# Patient Record
Sex: Female | Born: 1965 | Race: White | Hispanic: No | Marital: Single | Smoking: Current every day smoker
Health system: Southern US, Community
[De-identification: ages and names within clinical notes are randomized; demographics above are authoritative.]

## PROBLEM LIST (undated history)

## (undated) DIAGNOSIS — E119 Type 2 diabetes mellitus without complications: Secondary | ICD-10-CM

## (undated) DIAGNOSIS — K219 Gastro-esophageal reflux disease without esophagitis: Secondary | ICD-10-CM

## (undated) DIAGNOSIS — E079 Disorder of thyroid, unspecified: Secondary | ICD-10-CM

## (undated) DIAGNOSIS — E249 Cushing's syndrome, unspecified: Secondary | ICD-10-CM

## (undated) DIAGNOSIS — J449 Chronic obstructive pulmonary disease, unspecified: Secondary | ICD-10-CM

## (undated) DIAGNOSIS — I10 Essential (primary) hypertension: Secondary | ICD-10-CM

## (undated) HISTORY — PX: TUBAL LIGATION: SHX77

## (undated) HISTORY — PX: BLADDER SURGERY: SHX569

## (undated) HISTORY — PX: PARTIAL HYSTERECTOMY: SHX80

## (undated) HISTORY — PX: DILATION AND CURETTAGE OF UTERUS: SHX78

---

## 1999-09-26 ENCOUNTER — Emergency Department (HOSPITAL_COMMUNITY): Admission: EM | Admit: 1999-09-26 | Discharge: 1999-09-26 | Payer: Self-pay | Admitting: Emergency Medicine

## 1999-10-13 ENCOUNTER — Emergency Department (HOSPITAL_COMMUNITY): Admission: EM | Admit: 1999-10-13 | Discharge: 1999-10-13 | Payer: Self-pay | Admitting: Emergency Medicine

## 2002-06-21 ENCOUNTER — Emergency Department (HOSPITAL_COMMUNITY): Admission: EM | Admit: 2002-06-21 | Discharge: 2002-06-21 | Payer: Self-pay | Admitting: Emergency Medicine

## 2003-02-18 ENCOUNTER — Emergency Department (HOSPITAL_COMMUNITY): Admission: EM | Admit: 2003-02-18 | Discharge: 2003-02-18 | Payer: Self-pay

## 2003-08-18 ENCOUNTER — Other Ambulatory Visit: Admission: RE | Admit: 2003-08-18 | Discharge: 2003-08-18 | Payer: Self-pay | Admitting: Obstetrics and Gynecology

## 2004-07-24 ENCOUNTER — Emergency Department (HOSPITAL_COMMUNITY): Admission: EM | Admit: 2004-07-24 | Discharge: 2004-07-25 | Payer: Self-pay | Admitting: Emergency Medicine

## 2004-09-18 ENCOUNTER — Emergency Department (HOSPITAL_COMMUNITY): Admission: EM | Admit: 2004-09-18 | Discharge: 2004-09-18 | Payer: Self-pay | Admitting: Emergency Medicine

## 2005-04-02 ENCOUNTER — Other Ambulatory Visit: Admission: RE | Admit: 2005-04-02 | Discharge: 2005-04-02 | Payer: Self-pay | Admitting: Obstetrics and Gynecology

## 2006-01-14 ENCOUNTER — Emergency Department (HOSPITAL_COMMUNITY): Admission: EM | Admit: 2006-01-14 | Discharge: 2006-01-15 | Payer: Self-pay | Admitting: Emergency Medicine

## 2006-07-03 ENCOUNTER — Emergency Department (HOSPITAL_COMMUNITY): Admission: EM | Admit: 2006-07-03 | Discharge: 2006-07-03 | Payer: Self-pay | Admitting: Emergency Medicine

## 2006-07-05 ENCOUNTER — Ambulatory Visit: Payer: Self-pay | Admitting: Vascular Surgery

## 2006-07-05 ENCOUNTER — Emergency Department (HOSPITAL_COMMUNITY): Admission: EM | Admit: 2006-07-05 | Discharge: 2006-07-05 | Payer: Self-pay | Admitting: Emergency Medicine

## 2007-03-03 ENCOUNTER — Emergency Department (HOSPITAL_COMMUNITY): Admission: EM | Admit: 2007-03-03 | Discharge: 2007-03-03 | Payer: Self-pay | Admitting: Emergency Medicine

## 2008-06-01 ENCOUNTER — Ambulatory Visit (HOSPITAL_COMMUNITY): Admission: RE | Admit: 2008-06-01 | Discharge: 2008-06-01 | Payer: Self-pay | Admitting: Family Medicine

## 2008-06-04 ENCOUNTER — Encounter: Admission: RE | Admit: 2008-06-04 | Discharge: 2008-06-04 | Payer: Self-pay | Admitting: Family Medicine

## 2009-06-07 ENCOUNTER — Encounter: Admission: RE | Admit: 2009-06-07 | Discharge: 2009-06-07 | Payer: Self-pay | Admitting: Family Medicine

## 2009-08-03 ENCOUNTER — Encounter: Payer: Self-pay | Admitting: Family Medicine

## 2009-08-03 ENCOUNTER — Ambulatory Visit (HOSPITAL_COMMUNITY): Admission: RE | Admit: 2009-08-03 | Discharge: 2009-08-03 | Payer: Self-pay | Admitting: Family Medicine

## 2009-08-03 ENCOUNTER — Ambulatory Visit: Payer: Self-pay | Admitting: Vascular Surgery

## 2009-12-17 ENCOUNTER — Emergency Department (HOSPITAL_COMMUNITY): Admission: EM | Admit: 2009-12-17 | Discharge: 2009-12-17 | Payer: Self-pay | Admitting: Emergency Medicine

## 2010-05-25 ENCOUNTER — Emergency Department (HOSPITAL_COMMUNITY)
Admission: EM | Admit: 2010-05-25 | Discharge: 2010-05-25 | Payer: Self-pay | Source: Home / Self Care | Admitting: Emergency Medicine

## 2010-05-25 LAB — POCT I-STAT, CHEM 8
BUN: 11 mg/dL (ref 6–23)
Calcium, Ion: 1.12 mmol/L (ref 1.12–1.32)
Chloride: 105 mEq/L (ref 96–112)
Creatinine, Ser: 0.8 mg/dL (ref 0.4–1.2)
Glucose, Bld: 95 mg/dL (ref 70–99)
TCO2: 26 mmol/L (ref 0–100)

## 2010-05-25 LAB — LIPASE, BLOOD: Lipase: 27 U/L (ref 11–59)

## 2010-07-13 LAB — URINALYSIS, ROUTINE W REFLEX MICROSCOPIC
Glucose, UA: NEGATIVE mg/dL
Hgb urine dipstick: NEGATIVE
Protein, ur: NEGATIVE mg/dL
Specific Gravity, Urine: 1.028 (ref 1.005–1.030)
pH: 5.5 (ref 5.0–8.0)

## 2010-07-13 LAB — POCT I-STAT, CHEM 8
BUN: 21 mg/dL (ref 6–23)
Calcium, Ion: 0.94 mmol/L — ABNORMAL LOW (ref 1.12–1.32)
Chloride: 109 mEq/L (ref 96–112)
Creatinine, Ser: 0.4 mg/dL (ref 0.4–1.2)

## 2010-07-13 LAB — POCT PREGNANCY, URINE

## 2010-07-13 LAB — WET PREP, GENITAL
Trich, Wet Prep: NONE SEEN
Yeast Wet Prep HPF POC: NONE SEEN

## 2010-07-13 LAB — GC/CHLAMYDIA PROBE AMP, GENITAL

## 2011-02-21 ENCOUNTER — Other Ambulatory Visit: Payer: Self-pay | Admitting: Family Medicine

## 2011-02-21 DIAGNOSIS — Z1231 Encounter for screening mammogram for malignant neoplasm of breast: Secondary | ICD-10-CM

## 2011-03-09 ENCOUNTER — Ambulatory Visit
Admission: RE | Admit: 2011-03-09 | Discharge: 2011-03-09 | Disposition: A | Discharge status: Home / Self Care | Payer: BC Managed Care – PPO | Source: Ambulatory Visit | Attending: Family Medicine | Admitting: Family Medicine

## 2011-03-09 DIAGNOSIS — Z1231 Encounter for screening mammogram for malignant neoplasm of breast: Secondary | ICD-10-CM

## 2011-10-02 ENCOUNTER — Ambulatory Visit: Payer: BC Managed Care – PPO

## 2011-10-02 ENCOUNTER — Ambulatory Visit (INDEPENDENT_AMBULATORY_CARE_PROVIDER_SITE_OTHER): Payer: BC Managed Care – PPO | Admitting: Family Medicine

## 2011-10-02 VITALS — BP 113/72 | HR 84 | Temp 98.5°F | Resp 20 | Ht 65.0 in | Wt 198.4 lb

## 2011-10-02 DIAGNOSIS — R5381 Other malaise: Secondary | ICD-10-CM

## 2011-10-02 DIAGNOSIS — R05 Cough: Secondary | ICD-10-CM

## 2011-10-02 DIAGNOSIS — R059 Cough, unspecified: Secondary | ICD-10-CM

## 2011-10-02 DIAGNOSIS — M76899 Other specified enthesopathies of unspecified lower limb, excluding foot: Secondary | ICD-10-CM

## 2011-10-02 DIAGNOSIS — M7061 Trochanteric bursitis, right hip: Secondary | ICD-10-CM

## 2011-10-02 LAB — POCT CBC
Granulocyte percent: 66.5 %G (ref 37–80)
MCH, POC: 27 pg (ref 27–31.2)
MCV: 84.6 fL (ref 80–97)
MID (cbc): 0.9 (ref 0–0.9)
MPV: 10.8 fL (ref 0–99.8)
POC LYMPH PERCENT: 27.5 %L (ref 10–50)
POC MID %: 6 %M (ref 0–12)
Platelet Count, POC: 410 10*3/uL (ref 142–424)
RDW, POC: 15.1 %
WBC: 15.2 10*3/uL — AB (ref 4.6–10.2)

## 2011-10-02 MED ORDER — PREDNISONE 50 MG PO TABS: ORAL_TABLET | ORAL | Status: AC

## 2011-10-02 MED ORDER — TRAMADOL-ACETAMINOPHEN 37.5-325 MG PO TABS: 2.0000 | ORAL_TABLET | Freq: Four times a day (QID) | ORAL | Status: AC | PRN

## 2011-10-02 NOTE — Progress Notes (Signed)
  Subjective:    Patient ID: Katherine Blair, female    DOB: 1966-03-28, 46 y.o.   MRN: [id number]  HPI Bilateral hip pain x 1 month.  Pain is persistent.  No alleviating or aggravating factors.  Pt works as a Investment banker, corporate.  No trauma or strenuous activity, though pt does pick up granddaughter on a regular basis.  No distal paresthesias  Pt also with fatigue over same time period.  Had had decreased energy.  Feels that she may be over working. Mood has been stable.  Has had some unintentional weight gain.  No blurry vision, polyuria, polydyspia.  Pt also has 30 pack year smoker   Review of Systems See HPI, otherwise ROS negative     Objective:   Physical Exam Gen: up in chair, NAD HEENT: NCAT, EOMI, TMs clear bilaterally CV: RRR, no murmurs auscultated PULM: CTAB, no wheezes, rales, rhoncii ABD: S/NT/large abdomen, + bowel sounds.  EXT: 2+ peripheral pulses MSK: + lumbar TTP,  + TTP over trochanteric bursa bilaterally, + pain with hip abduction bilaterally.    [facility] reading (PRIMARY) by  Dr. Alvester Morin CXR negative for infiltrate.   Assessment & Plan:  Bilateral trochanteric bursitis: Will treat with oral steroids given bilateral nature and underlying COPD.   Fatigue: relatively broad differential for this. WiIl check metabolic causes with labs including TSH, CBC, CMET. Some concern for malignancy given smoking history. CXR negative

## 2011-10-02 NOTE — Patient Instructions (Signed)
Fatigue Fatigue is a feeling of tiredness, lack of energy, lack of motivation, or feeling tired all the time. Having enough rest, good nutrition, and reducing stress will normally reduce fatigue. Consult your caregiver if it persists. The nature of your fatigue will help your caregiver to find out its cause. The treatment is based on the cause.  CAUSES  There are many causes for fatigue. Most of the time, fatigue can be traced to one or more of your habits or routines. Most causes fit into one or more of three general areas. They are: Lifestyle problems  Sleep disturbances.   Overwork.   Physical exertion.   Unhealthy habits.   Poor eating habits or eating disorders.   Alcohol and/or drug use .   Lack of proper nutrition (malnutrition).  Psychological problems  Stress and/or anxiety problems.   Depression.   Grief.   Boredom.  Medical Problems or Conditions  Anemia.   Pregnancy.   Thyroid gland problems.   Recovery from major surgery.   Continuous pain.   Emphysema or asthma that is not well controlled   Allergic conditions.   Diabetes.   Infections (such as mononucleosis).   Obesity.   Sleep disorders, such as sleep apnea.   Heart failure or other heart-related problems.   Cancer.   Kidney disease.   Liver disease.   Effects of certain medicines such as antihistamines, cough and cold remedies, prescription pain medicines, heart and blood pressure medicines, drugs used for treatment of cancer, and some antidepressants.  SYMPTOMS  The symptoms of fatigue include:   Lack of energy.   Lack of drive (motivation).   Drowsiness.   Feeling of indifference to the surroundings.  DIAGNOSIS  The details of how you feel help guide your caregiver in finding out what is causing the fatigue. You will be asked about your present and past health condition. It is important to review all medicines that you take, including prescription and non-prescription items. A  thorough exam will be done. You will be questioned about your feelings, habits, and normal lifestyle. Your caregiver may suggest blood tests, urine tests, or other tests to look for common medical causes of fatigue.  TREATMENT  Fatigue is treated by correcting the underlying cause. For example, if you have continuous pain or depression, treating these causes will improve how you feel. Similarly, adjusting the dose of certain medicines will help in reducing fatigue.  HOME CARE INSTRUCTIONS   Try to get the required amount of good sleep every night.   Eat a healthy and nutritious diet, and drink enough water throughout the day.   Practice ways of relaxing (including yoga or meditation).   Exercise regularly.   Make plans to change situations that cause stress. Act on those plans so that stresses decrease over time. Keep your work and personal routine reasonable.   Avoid street drugs and minimize use of alcohol.   Start taking a daily multivitamin after consulting your caregiver.  SEEK MEDICAL CARE IF:   You have persistent tiredness, which cannot be accounted for.   You have fever.   You have unintentional weight loss.   You have headaches.   You have disturbed sleep throughout the night.   You are feeling sad.   You have constipation.   You have dry skin.   You have gained weight.   You are taking any new or different medicines that you suspect are causing fatigue.   You are unable to sleep at night.     You develop any unusual swelling of your legs or other parts of your body.  SEEK IMMEDIATE MEDICAL CARE IF:   You are feeling confused.   Your vision is blurred.   You feel faint or pass out.   You develop severe headache.   You develop severe abdominal, pelvic, or back pain.   You develop chest pain, shortness of breath, or an irregular or fast heartbeat.   You are unable to pass a normal amount of urine.   You develop abnormal bleeding such as bleeding from  the rectum or you vomit blood.   You have thoughts about harming yourself or committing suicide.   You are worried that you might harm someone else.  MAKE SURE YOU:   Understand these instructions.   Will watch your condition.   Will get help right away if you are not doing well or get worse.  Document Released: 02/11/2007 Document Revised: 04/05/2011 Document Reviewed: 02/11/2007 [facility] Patient Information 2012 [place], [place].  Trochanteric Bursitis You have hip pain due to trochanteric bursitis. Bursitis means that the sack near the outside of the hip is filled with fluid and inflamed. This sack is made up of protective soft tissue. The pain from trochanteric bursitis can be severe and keep you from sleep. It can radiate to the buttocks or down the outside of the thigh to the knee. The pain is almost always worse when rising from the seated or lying position and with walking. Pain can improve after you take a few steps. It happens more often in people with hip joint and lumbar spine problems, such as arthritis or previous surgery. Very rarely the trochanteric bursa can become infected, and antibiotics and/or surgery may be needed. Treatment often includes an injection of local anesthetic mixed with cortisone medicine. This medicine is injected into the area where it is most tender over the hip. Repeat injections may be necessary if the response to treatment is slow. You can apply ice packs over the tender area for 30 minutes every 2 hours for the next few days. Anti-inflammatory and/or narcotic pain medicine may also be helpful. Limit your activity for the next few days if the pain continues. See your caregiver in 5-10 days if you are not greatly improved.  SEEK IMMEDIATE MEDICAL CARE IF:  You develop severe pain, fever, or increased redness.   You have pain that radiates below the knee.  EXERCISES STRETCHING EXERCISES - Trochantic Bursitis  These exercises may help you when beginning  to rehabilitate your injury. Your symptoms may resolve with or without further involvement from your physician, physical therapist or athletic trainer. While completing these exercises, remember:   Restoring tissue flexibility helps normal motion to return to the joints. This allows healthier, less painful movement and activity.   An effective stretch should be held for at least 30 seconds.   A stretch should never be painful. You should only feel a gentle lengthening or release in the stretched tissue.  STRETCH - Iliotibial Band  On the floor or bed, lie on your side so your injured leg is on top. Bend your knee and grab your ankle.   Slowly bring your knee back so that your thigh is in line with your trunk. Keep your heel at your buttocks and gently arch your back so your head, shoulders and hips line up.   Slowly lower your leg so that your knee approaches the floor/bed until you feel a gentle stretch on the outside of your thigh. If  you do not feel a stretch and your knee will not fall farther, place the heel of your opposite foot on top of your knee and pull your thigh down farther.   Hold this stretch for __________ seconds.   Repeat __________ times. Complete this exercise __________ times per day.  STRETCH - Hamstrings, Supine   Lie on your back. Loop a belt or towel over the ball of your foot as shown.   Straighten your knee and slowly pull on the belt to raise your injured leg. Do not allow the knee to bend. Keep your opposite leg flat on the floor.   Raise the leg until you feel a gentle stretch behind your knee or thigh. Hold this position for __________ seconds.   Repeat __________ times. Complete this stretch __________ times per day.  STRETCH - Quadriceps, Prone   Lie on your stomach on a firm surface, such as a bed or padded floor.   Bend your knee and grasp your ankle. If you are unable to reach, your ankle or pant leg, use a belt around your foot to lengthen your  reach.   Gently pull your heel toward your buttocks. Your knee should not slide out to the side. You should feel a stretch in the front of your thigh and/or knee.   Hold this position for __________ seconds.   Repeat __________ times. Complete this stretch __________ times per day.  STRETCHING - Hip Flexors, Lunge Half kneel with your knee on the floor and your opposite knee bent and directly over your ankle.  Keep good posture with your head over your shoulders. Tighten your buttocks to point your tailbone downward; this will prevent your back from arching too much.   You should feel a gentle stretch in the front of your thigh and/or hip. If you do not feel any resistance, slightly slide your opposite foot forward and then slowly lunge forward so your knee once again lines up over your ankle. Be sure your tailbone remains pointed downward.   Hold this stretch for __________ seconds.   Repeat __________ times. Complete this stretch __________ times per day.  STRETCH - Adductors, Lunge  While standing, spread your legs   Lean away from your injured leg by bending your opposite knee. You may rest your hands on your thigh for balance.   You should feel a stretch in your inner thigh. Hold for __________ seconds.   Repeat __________ times. Complete this exercise __________ times per day.  Document Released: 05/24/2004 Document Revised: 04/05/2011 Document Reviewed: 07/29/2008 [facility] Patient Information 2012 [place], [place].

## 2011-10-03 LAB — COMPREHENSIVE METABOLIC PANEL
Alkaline Phosphatase: 89 U/L (ref 39–117)
BUN: 10 mg/dL (ref 6–23)
Glucose, Bld: 99 mg/dL (ref 70–99)
Total Bilirubin: 0.3 mg/dL (ref 0.3–1.2)

## 2011-10-04 ENCOUNTER — Telehealth: Payer: Self-pay | Admitting: Radiology

## 2011-10-04 NOTE — Telephone Encounter (Signed)
Dr. Alvester Morin,  Lorain Childes

## 2011-10-04 NOTE — Telephone Encounter (Signed)
I called patient to advise on needing follow up chest xray and mailed the report to her. She is asking if the findings on chest xray can relate to her elevated white count? She states for the past few years her white count is elevated whenever she has it checked. Pls advise. ( I called pt at work # but better  Contact # for her  Is [phone]) Katherine Blair

## 2011-10-04 NOTE — Telephone Encounter (Signed)
Message copied by Caffie Damme on Thu Oct 04, 2011 12:52 PM ------      Message from: Floydene Flock      Created: Wed Oct 03, 2011 10:48 AM       Please instruct pt that she will need a follow up CXR based on the radiologists recommendations in 1 month.       Pt can get CXR here or at [facility].             Thank you,      SN      ----- Message -----         From: Rad Results In Interface         Sent: 10/02/2011   9:27 PM           To: Floydene Flock, MD

## 2011-10-04 NOTE — Telephone Encounter (Signed)
Message copied by Caffie Damme on Thu Oct 04, 2011 12:44 PM ------      Message from: Floydene Flock      Created: Wed Oct 03, 2011 10:48 AM       Please instruct pt that she will need a follow up CXR based on the radiologists recommendations in 1 month.       Pt can get CXR here or at [facility].             Thank you,      SN      ----- Message -----         From: Rad Results In Interface         Sent: 10/02/2011   9:27 PM           To: Floydene Flock, MD

## 2011-10-05 ENCOUNTER — Ambulatory Visit: Payer: Self-pay | Admitting: Family Medicine

## 2011-10-05 NOTE — Telephone Encounter (Signed)
Called to follow up with pt about lab results. Discussed with pt that elevated white count may be from mild bronchitis or from smoking.  Pt states that her symptoms have been clinically improving.  Still with some fatigue.  Trochanteric bursitis has resolved.  Told her to come in if her symptoms worsened.  Pt agreeable to this.

## 2011-11-13 ENCOUNTER — Ambulatory Visit: Payer: BC Managed Care – PPO

## 2011-11-13 ENCOUNTER — Ambulatory Visit (INDEPENDENT_AMBULATORY_CARE_PROVIDER_SITE_OTHER): Payer: BC Managed Care – PPO | Admitting: Family Medicine

## 2011-11-13 ENCOUNTER — Encounter: Payer: Self-pay | Admitting: Family Medicine

## 2011-11-13 VITALS — BP 116/76 | HR 108 | Temp 98.5°F | Resp 16 | Ht 64.5 in | Wt 201.4 lb

## 2011-11-13 DIAGNOSIS — D72829 Elevated white blood cell count, unspecified: Secondary | ICD-10-CM

## 2011-11-13 DIAGNOSIS — Z862 Personal history of diseases of the blood and blood-forming organs and certain disorders involving the immune mechanism: Secondary | ICD-10-CM

## 2011-11-13 DIAGNOSIS — D709 Neutropenia, unspecified: Secondary | ICD-10-CM

## 2011-11-13 DIAGNOSIS — R9389 Abnormal findings on diagnostic imaging of other specified body structures: Secondary | ICD-10-CM

## 2011-11-13 DIAGNOSIS — R5383 Other fatigue: Secondary | ICD-10-CM

## 2011-11-13 DIAGNOSIS — F17200 Nicotine dependence, unspecified, uncomplicated: Secondary | ICD-10-CM

## 2011-11-13 DIAGNOSIS — R918 Other nonspecific abnormal finding of lung field: Secondary | ICD-10-CM

## 2011-11-13 DIAGNOSIS — R5382 Chronic fatigue, unspecified: Secondary | ICD-10-CM

## 2011-11-13 LAB — POCT CBC
Hemoglobin: 14.6 g/dL (ref 12.2–16.2)
Lymph, poc: 3.9 — AB (ref 0.6–3.4)
MCH, POC: 27.3 pg (ref 27–31.2)
MCHC: 30.9 g/dL — AB (ref 31.8–35.4)
MID (cbc): 1 — AB (ref 0–0.9)
MPV: 10.4 fL (ref 0–99.8)
POC LYMPH PERCENT: 24.8 %L (ref 10–50)
POC MID %: 6.3 %M (ref 0–12)
Platelet Count, POC: 395 10*3/uL (ref 142–424)
RDW, POC: 15 %
WBC: 15.7 10*3/uL — AB (ref 4.6–10.2)

## 2011-11-13 NOTE — Progress Notes (Signed)
Subjective: 46 year old lady who was here last month and had a chest x-ray. The initial impression was that it was normal. The radiologist however noted a nodular area behind left border of the apex of the heart. This could simply be a confluence of shadows, but he recommended a followup chest x-ray after about a month. The patient is return for that. She has continued to have a cough, and suggestion they wondered if she was getting a little worse. She has been a smoker for many years, having quit a number of times, the longest for 9 months. She says it is the hardest thing in her life to do, and when she cuts out cigarettes she gains weight which is an additional problem for her.  Patient has fatigue all the time. She notes that she has had a high white blood count every time it's been checked,and wonders whether that could be related.  Objective: Her neck is supple. Chest is clear to auscultation. Heart regular.  I reviewed the previous x-rays and can see what the radiologist was looking at, though I would not it started it myself.  Assessment: Abnormal chest x-ray Tobacco abuse Cough Leukocytosis Fatigue  Plan: Chest x-ray CBC [facility] reading (PRIMARY) by  Dr. Alwyn Ren I still can see the area that was of concern, but looks like confluence of rib, breast, and heart shadows to me.  Will await radiology interpretation.Marland Kitchen

## 2011-11-15 ENCOUNTER — Telehealth: Payer: Self-pay | Admitting: Hematology & Oncology

## 2011-11-15 NOTE — Telephone Encounter (Signed)
Pt aware of 11-29-11 appoint

## 2011-11-16 ENCOUNTER — Ambulatory Visit
Admission: RE | Admit: 2011-11-16 | Discharge: 2011-11-16 | Disposition: A | Discharge status: Home / Self Care | Payer: BC Managed Care – PPO | Source: Ambulatory Visit | Attending: Emergency Medicine | Admitting: Emergency Medicine

## 2011-11-16 ENCOUNTER — Telehealth: Payer: Self-pay | Admitting: Radiology

## 2011-11-16 DIAGNOSIS — R9389 Abnormal findings on diagnostic imaging of other specified body structures: Secondary | ICD-10-CM

## 2011-11-16 NOTE — Telephone Encounter (Signed)
[facility] called at 6:21pm with call report on CT chest; according to report: no acute findings.  Report to Dr Cleta Alberts to address adrenal and splenic findings. Patient notified and informed Dr Cleta Alberts would call with further instructions regarding report.

## 2011-11-18 NOTE — Telephone Encounter (Signed)
Patient notified and report sent to Dr. Alwyn Ren.  Patient mailed report as well.

## 2011-11-18 NOTE — Telephone Encounter (Signed)
These areas on the spleen and adrenal do not need specific treatment.Be sure a copy of the CT is sent to Dr. Alwyn Ren.

## 2011-11-19 ENCOUNTER — Telehealth: Payer: Self-pay | Admitting: Family Medicine

## 2011-11-19 NOTE — Telephone Encounter (Signed)
Spoke with patient.

## 2011-11-19 NOTE — Progress Notes (Signed)
spoke with patient *Come in and followup on her fatty liver an enlarged adrenal gland and general fatigue. She will do so sometime in the next few weeks

## 2011-11-22 ENCOUNTER — Telehealth: Payer: Self-pay | Admitting: Emergency Medicine

## 2011-11-22 DIAGNOSIS — E278 Other specified disorders of adrenal gland: Secondary | ICD-10-CM

## 2011-11-22 NOTE — Telephone Encounter (Signed)
Please call patient had lab her know her chest x-ray did not show any masses. She did have significant fatty changes in the liver which should be treated with a low-fat diet she also showed some evidence of possible adrenal hyperplasia. I would advise she let me make her an appointment to see Dr. Talmage Nap and endocrinologist to discuss these findings to see if any further workup is indicated. Please make the appt. If she is willing.

## 2011-11-25 NOTE — Telephone Encounter (Signed)
PT NOTIFIED AND WILL SEE DR Talmage Nap

## 2011-11-25 NOTE — Telephone Encounter (Signed)
LMOM TO CB 

## 2011-11-29 ENCOUNTER — Other Ambulatory Visit (HOSPITAL_BASED_OUTPATIENT_CLINIC_OR_DEPARTMENT_OTHER): Payer: BC Managed Care – PPO | Admitting: Lab

## 2011-11-29 ENCOUNTER — Ambulatory Visit (HOSPITAL_BASED_OUTPATIENT_CLINIC_OR_DEPARTMENT_OTHER): Payer: BC Managed Care – PPO | Admitting: Hematology & Oncology

## 2011-11-29 ENCOUNTER — Ambulatory Visit: Payer: BC Managed Care – PPO

## 2011-11-29 VITALS — BP 114/74 | HR 64 | Temp 97.7°F | Ht 64.5 in | Wt 210.0 lb

## 2011-11-29 DIAGNOSIS — D72829 Elevated white blood cell count, unspecified: Secondary | ICD-10-CM

## 2011-11-29 LAB — CBC WITH DIFFERENTIAL (CANCER CENTER ONLY)
BASO#: 0 10*3/uL (ref 0.0–0.2)
Eosinophils Absolute: 0.4 10*3/uL (ref 0.0–0.5)
HGB: 14.4 g/dL (ref 11.6–15.9)
LYMPH#: 4 10*3/uL — ABNORMAL HIGH (ref 0.9–3.3)
MONO#: 0.9 10*3/uL (ref 0.1–0.9)
NEUT#: 9.1 10*3/uL — ABNORMAL HIGH (ref 1.5–6.5)
RBC: 5.05 10*6/uL (ref 3.70–5.32)
WBC: 14.4 10*3/uL — ABNORMAL HIGH (ref 3.9–10.0)

## 2011-11-29 LAB — CHCC SATELLITE - SMEAR

## 2011-11-29 NOTE — Progress Notes (Signed)
This office note has been dictated.

## 2011-11-30 NOTE — Progress Notes (Signed)
CC:   Katherine Najjar, MD  DIAGNOSIS:  Chronic leukocytosis.  HISTORY OF PRESENT ILLNESS:  Katherine Blair is a really nice, 46 year old white female.  She certainly looks younger.  She is already a grandmother.  She is followed by Dr. Janace Hoard.  Dr. Alwyn Ren noted that there was some elevation of her white cell count.  According to Katherine Blair, this apparently has been noted for a few years.  The last set that I have on her were from June.  At that point in time, her CBC showed white count 15.2, hemoglobin 14, hematocrit 44, platelet count 410,000.  MCV 85.  She had a slightly elevated TSH.  I think she is on Synthroid now.  Her electrolytes all looked okay.  She has had no problem with recurrent infections.  She has had a cough. She has had a chest x-ray and CT scans done.  Her CT scan was done back on July 19th.  This did not show any suspicious pulmonary nodules. There is no adenopathy noted.  She has hepatic steatosis.  No lymphadenopathy was appreciated in the abdomen or pelvis.  She has not noted any joint aches or pains.  She does have some pain in her hips.  She was told that she has "bursitis" in her right hip.  She had been on intermittent steroids.  The last time she was on steroids was about 8 weeks ago.  She does feel tired.  She has had no weight loss.  There may be some weight gain.  She does still smoke.  She has had a hard time cutting back on this.  She is still working.  She is a Investment banker, corporate.  She did go to the beach recently.  Unfortunately, she did not wear sunscreen.  She does have [place] in her.  She did not get sun burned.  She has had no change in bowel or bladder habits.  She has not noticed any palpable lymph glands. She does have her mammograms routinely.  We were kindly asked to see her to try to help make recommendations for the leukocytosis.  PAST MEDICAL HISTORY: 1. Remarkable for bursitis. 2. "Pre diabetes." 3.  Hypothyroidismism.  PAST SURGICAL HISTORY:  She has had a partial hysterectomy.  Her ovaries have been left in.  She has not noted any type of menopausal symptoms.  ALLERGIES:  None.  MEDICATIONS: 1. Metformin 500 mg p.o. daily. 2. Synthroid 0.05 mg p.o. daily.  SOCIAL HISTORY:  Remarkable for about a 30 pack-year history of tobacco use.  She is smoking between 1 and 1-1/2 packs a day.  There is maybe social alcohol use.  There is no occupational exposures.  FAMILY HISTORY:  Relatively noncontributory.  She is not aware of any kind of blood issues in the family.  REVIEW OF SYSTEMS:  As stated in history of present illness.  No additional findings are noted on a 12-system review.  PHYSICAL EXAMINATION:  General:  This is a well-developed, well- nourished white female in no obvious distress.  She is alert and orient x3.  Vital signs:  Show a temperature of 97.7, pulse 64, respiratory rate 18, blood pressure 114/74.  Weight is 201.  Head and neck: Normocephalic, atraumatic skull.  There are no ocular or oral lesions. There are no palpable cervical or supraclavicular lymph nodes.  Lungs: Clear bilaterally.  There are no rales, wheeze or rhonchi.  Cardiac: Regular rate and rhythm with a normal S1 and S2.  There are  no murmurs, rubs or bruits.  Axillary exam shows no bilateral axillary adenopathy. Abdomen:  Soft with good bowel sounds.  There is no palpable abdominal mass.  There is no fluid wave.  There is no palpable hepatosplenomegaly. Back: Shows no tenderness over the spine, ribs, or hips.  Extremities: Shows no clubbing, cyanosis or edema.  She has a lot of tightness in her lateral thigh muscles in the right thigh.  She has good pulses in her distal extremities.  Skin:  Shows no rashes, ecchymoses or petechia. Neurological:  Shows no focal neurological deficits.  LABORATORY STUDIES:  White cell count is 14.4, hemoglobin 14.4, hematocrit 42.8, platelet count 330.  White cell  differential shows 64 segs, 28 lymphocytes, 6 monos, 3 eosinophils.  Peripheral smear shows a normochromic, normocytic population of red blood cells.  There is no target cells.  There is no nucleated red blood cells.  I see no teardrop cells.  She has no schistocyte or spherocytes.  White cells appear slightly increased in number.  She has good maturation of her white blood cells.  There is no hypersegmented polys.  There are no atypical lymphocytes.  I see no immature myeloid cells.  There are no blasts. Platelets are adequate in number and size.  IMPRESSION:  Katherine Blair is a real nice, 46 year old white female with mild leukocytosis.  Her blood smear is very reassuring.  The fact that she does not have any findings on her physical exam also is reassuring.  I have to believe that the white cell elevation is more of a reactive process.  I suppose that it might be a "reset" of the bone marrow.  The fact that she smokes certainly could lead to some white cell elevation. Sometimes, the increase in carbon monoxide within the blood might trigger the bone marrow to become a little bit more "active" and increase its white cell production.  I find it very interesting that she has this "severe" hepatic steatosis.  This also might lead to white cell elevation if there is chronic inflammation with respect to the liver. One would have to worry about it overtime, this might lead to leukopenia if she does develop any cirrhosis.  I do not think her thyroid would be a factor with her white cell elevation.  I know that hyperthyroidism could lead to leukocytosis; however, she may have some hypothyroidism, which I do not believe is really associated with leukocytosis.  I do not see any indication that we need to do a bone marrow test.  I just believe the bone marrow test would come out normal and that we would not find any marrow abnormalities.  I spent a good hour or so with Katherine Blair.  I showed her,  her blood work. I reviewed my recommendations to her.  I went over my recommendations with her.  She really was not too worried.  She said that she did not think that there was a problem with her blood.  I think she is more worried about the bursitis and just being tired.  I just do not think that we need to see Katherine Blair back.  She is very, very nice.  It was fun talking to her.  I just do not think that we would be adding much to her healthcare.  She is followed by Dr. Janace Hoard.  He is very thorough, and certainly could do her CBC once or twice a year.  I think that if we, for some reason, see  that her white cell count trends upward on a consistent basis, then we could get her back.  I suspect that if she were to cut back and stop smoking, the white cells likely would begin to normalize.    ______________________________ Josph Macho, M.D. PRE/MEDQ  D:  11/29/2011  T:  11/30/2011  Job:  [id number]

## 2014-04-26 ENCOUNTER — Emergency Department (HOSPITAL_COMMUNITY)
Admission: EM | Admit: 2014-04-26 | Discharge: 2014-04-26 | Disposition: A | Discharge status: Home / Self Care | Payer: BC Managed Care – PPO | Attending: Emergency Medicine | Admitting: Emergency Medicine

## 2014-04-26 ENCOUNTER — Encounter (HOSPITAL_COMMUNITY): Payer: Self-pay

## 2014-04-26 DIAGNOSIS — Z720 Tobacco use: Secondary | ICD-10-CM | POA: Insufficient documentation

## 2014-04-26 DIAGNOSIS — M62838 Other muscle spasm: Secondary | ICD-10-CM | POA: Diagnosis not present

## 2014-04-26 DIAGNOSIS — Z79899 Other long term (current) drug therapy: Secondary | ICD-10-CM | POA: Insufficient documentation

## 2014-04-26 DIAGNOSIS — Z7982 Long term (current) use of aspirin: Secondary | ICD-10-CM | POA: Insufficient documentation

## 2014-04-26 DIAGNOSIS — R531 Weakness: Secondary | ICD-10-CM

## 2014-04-26 DIAGNOSIS — E079 Disorder of thyroid, unspecified: Secondary | ICD-10-CM | POA: Diagnosis not present

## 2014-04-26 HISTORY — DX: Cushing's syndrome, unspecified: E24.9

## 2014-04-26 HISTORY — DX: Disorder of thyroid, unspecified: E07.9

## 2014-04-26 LAB — CBC WITH DIFFERENTIAL/PLATELET
Basophils Absolute: 0 10*3/uL (ref 0.0–0.1)
Basophils Relative: 0 % (ref 0–1)
Eosinophils Absolute: 0.3 10*3/uL (ref 0.0–0.7)
Eosinophils Relative: 3 % (ref 0–5)
HCT: 44.3 % (ref 36.0–46.0)
Hemoglobin: 14.5 g/dL (ref 12.0–15.0)
LYMPHS ABS: 3.1 10*3/uL (ref 0.7–4.0)
LYMPHS PCT: 34 % (ref 12–46)
MCH: 29.1 pg (ref 26.0–34.0)
MCHC: 32.7 g/dL (ref 30.0–36.0)
MCV: 88.8 fL (ref 78.0–100.0)
Monocytes Absolute: 0.7 10*3/uL (ref 0.1–1.0)
Monocytes Relative: 7 % (ref 3–12)
NEUTROS ABS: 5.1 10*3/uL (ref 1.7–7.7)
NEUTROS PCT: 56 % (ref 43–77)
PLATELETS: 292 10*3/uL (ref 150–400)
RBC: 4.99 MIL/uL (ref 3.87–5.11)
RDW: 14.1 % (ref 11.5–15.5)
WBC: 9.3 10*3/uL (ref 4.0–10.5)

## 2014-04-26 LAB — COMPREHENSIVE METABOLIC PANEL
ALK PHOS: 66 U/L (ref 39–117)
ALT: 17 U/L (ref 0–35)
AST: 23 U/L (ref 0–37)
Albumin: 3.8 g/dL (ref 3.5–5.2)
Anion gap: 8 (ref 5–15)
BUN: 11 mg/dL (ref 6–23)
CHLORIDE: 107 meq/L (ref 96–112)
CO2: 23 mmol/L (ref 19–32)
Calcium: 8.8 mg/dL (ref 8.4–10.5)
Creatinine, Ser: 0.61 mg/dL (ref 0.50–1.10)
GFR calc non Af Amer: >90 mL/min (ref >90)
GLUCOSE: 153 mg/dL — AB (ref 70–99)
POTASSIUM: 4.1 mmol/L (ref 3.5–5.1)
SODIUM: 138 mmol/L (ref 135–145)
TOTAL PROTEIN: 7 g/dL (ref 6.0–8.3)
Total Bilirubin: 0.5 mg/dL (ref 0.3–1.2)

## 2014-04-26 LAB — TSH: TSH: 1.852 u[IU]/mL (ref 0.350–4.500)

## 2014-04-26 MED ORDER — CYCLOBENZAPRINE HCL 10 MG PO TABS: 10.0000 mg | ORAL_TABLET | Freq: Three times a day (TID) | ORAL | Status: DC | PRN

## 2014-04-26 MED ORDER — SODIUM CHLORIDE 0.9 % IV BOLUS (SEPSIS)
1000.0000 mL | Freq: Once | INTRAVENOUS | Status: AC
  Administered 2014-04-26: 1000 mL via INTRAVENOUS

## 2014-04-26 NOTE — ED Provider Notes (Signed)
CSN: [id number]     Arrival date & time 04/26/14  1233 History   First MD Initiated Contact with Patient 04/26/14 1404     Chief Complaint  Patient presents with  . Weakness     (Consider location/radiation/quality/duration/timing/severity/associated sxs/prior Treatment) HPI Comments: Patient with a history of Hypothyroidism and Cushing's Syndrome presents today with generalized weakness and also reports "tightness of her muscles from head to toe."  She reports that her symptoms have been present for weeks and gradually worsening.  She states that she started having the symptoms after her Synthroid dose was increased.  Therefore, her Endocrinologist  Decreased her dose,  but she continued to have symptoms.  She is requesting to have her TSH checked.  She denies fever, chills, chest pain, cough, SOB, focal weakness, numbness, or tingling.  Her Cushing's Syndrome is being treated with Korlym and Aldactone.    The history is provided by the patient.    Past Medical History  Diagnosis Date  . Cushing's syndrome   . Thyroid disease    Past Surgical History  Procedure Laterality Date  . Tubal ligation    . Partial hysterectomy    . Cesarean section    . Dilation and curettage of uterus    . Bladder surgery     History reviewed. No pertinent family history. History  Substance Use Topics  . Smoking status: Current Every Day Smoker -- 1.00 packs/day for 30 years    Types: Cigarettes  . Smokeless tobacco: Never Used  . Alcohol Use: Yes     Comment: social   OB History    No data available     Review of Systems  All other systems reviewed and are negative.     Allergies  Nsaids  Home Medications   Prior to Admission medications   Medication Sig Start Date End Date Taking? Authorizing Provider  aspirin 81 MG tablet Take 81 mg by mouth at bedtime.   Yes Historical Provider, MD  atorvastatin (LIPITOR) 20 MG tablet Take 20 mg by mouth at bedtime.   Yes Historical Provider, MD   levothyroxine (SYNTHROID, LEVOTHROID) 112 MCG tablet Take 112 mcg by mouth daily before breakfast.   Yes Historical Provider, MD  Mifepristone (KORLYM) 300 MG TABS Take 600 mg by mouth at bedtime.   Yes Historical Provider, MD  sertraline (ZOLOFT) 100 MG tablet Take 150 mg by mouth at bedtime.   Yes Historical Provider, MD  spironolactone (ALDACTONE) 50 MG tablet Take 50 mg by mouth daily.   Yes Historical Provider, MD  topiramate (TOPAMAX) 50 MG tablet Take 50 mg by mouth at bedtime.   Yes Historical Provider, MD  traMADol (ULTRAM) 50 MG tablet Take 50 mg by mouth at bedtime.   Yes Historical Provider, MD   BP 116/73 mmHg  Pulse 96  Temp(Src) 97.8 F (36.6 C) (Oral)  Resp 16  Ht 5\' 6"  (1.676 m)  Wt 197 lb (89.359 kg)  BMI 31.81 kg/m2  SpO2 98% Physical Exam  Constitutional: She appears well-developed and well-nourished.  HENT:  Head: Normocephalic and atraumatic.  Mouth/Throat: Oropharynx is clear and moist.  Eyes: EOM are normal. Pupils are equal, round, and reactive to light.  Neck: Normal range of motion. Neck supple.  Cardiovascular: Normal rate, regular rhythm and normal heart sounds.   Pulmonary/Chest: Effort normal and breath sounds normal.  Abdominal: Soft. Bowel sounds are normal. She exhibits no distension and no mass. There is no tenderness. There is no rebound and no guarding.  Musculoskeletal: Normal range of motion.  Neurological: She is alert. She has normal strength. No cranial nerve deficit or sensory deficit. Coordination and gait normal.  Skin: Skin is warm and dry.  Psychiatric: She has a normal mood and affect.  Nursing note and vitals reviewed.   ED Course  Procedures (including critical care time) Labs Review Labs Reviewed  CBC WITH DIFFERENTIAL  COMPREHENSIVE METABOLIC PANEL  TSH    Imaging Review No results found.   EKG Interpretation None     4:00 PM [facility] PA-C at shift change.  TSH pending.   MDM   Final  diagnoses:  None   Patient with a history of Cushing's Disease and Hypothyroidism presents today with generalized weakness, fatigue, and tightness of muscles "from head to toe."  She is requesting to have her TSH checked.  CBC and BMP unremarkable.  No neurological deficits or focal weakness on exam.  TSH pending at shift change.  Trixie DredgeEmily West, PA-C will follow up on TSH.      Santiago GladHeather Adain Geurin, PA-C 04/27/14 2200  Richardean Canalavid H Yao, MD 04/28/14 [phone]

## 2014-04-26 NOTE — Discharge Instructions (Signed)
Read the information below.  Use the prescribed medication as directed.  Please discuss all new medications with your pharmacist.  You may return to the Emergency Department at any time for worsening condition or any new symptoms that concern you.  If you develop fevers, uncontrolled pain, uncontrolled vomiting, if you pass out, or develop difficulty breathing or swallowing return to the ER for a recheck.      Fatigue Fatigue is a feeling of tiredness, lack of energy, lack of motivation, or feeling tired all the time. Having enough rest, good nutrition, and reducing stress will normally reduce fatigue. Consult your caregiver if it persists. The nature of your fatigue will help your caregiver to find out its cause. The treatment is based on the cause.  CAUSES  There are many causes for fatigue. Most of the time, fatigue can be traced to one or more of your habits or routines. Most causes fit into one or more of three general areas. They are: Lifestyle problems  Sleep disturbances.  Overwork.  Physical exertion.  Unhealthy habits.  Poor eating habits or eating disorders.  Alcohol and/or drug use .  Lack of proper nutrition (malnutrition). Psychological problems  Stress and/or anxiety problems.  Depression.  Grief.  Boredom. Medical Problems or Conditions  Anemia.  Pregnancy.  Thyroid gland problems.  Recovery from major surgery.  Continuous pain.  Emphysema or asthma that is not well controlled  Allergic conditions.  Diabetes.  Infections (such as mononucleosis).  Obesity.  Sleep disorders, such as sleep apnea.  Heart failure or other heart-related problems.  Cancer.  Kidney disease.  Liver disease.  Effects of certain medicines such as antihistamines, cough and cold remedies, prescription pain medicines, heart and blood pressure medicines, drugs used for treatment of cancer, and some antidepressants. SYMPTOMS  The symptoms of fatigue include:   Lack  of energy.  Lack of drive (motivation).  Drowsiness.  Feeling of indifference to the surroundings. DIAGNOSIS  The details of how you feel help guide your caregiver in finding out what is causing the fatigue. You will be asked about your present and past health condition. It is important to review all medicines that you take, including prescription and non-prescription items. A thorough exam will be done. You will be questioned about your feelings, habits, and normal lifestyle. Your caregiver may suggest blood tests, urine tests, or other tests to look for common medical causes of fatigue.  TREATMENT  Fatigue is treated by correcting the underlying cause. For example, if you have continuous pain or depression, treating these causes will improve how you feel. Similarly, adjusting the dose of certain medicines will help in reducing fatigue.  HOME CARE INSTRUCTIONS   Try to get the required amount of good sleep every night.  Eat a healthy and nutritious diet, and drink enough water throughout the day.  Practice ways of relaxing (including yoga or meditation).  Exercise regularly.  Make plans to change situations that cause stress. Act on those plans so that stresses decrease over time. Keep your work and personal routine reasonable.  Avoid street drugs and minimize use of alcohol.  Start taking a daily multivitamin after consulting your caregiver. SEEK MEDICAL CARE IF:   You have persistent tiredness, which cannot be accounted for.  You have fever.  You have unintentional weight loss.  You have headaches.  You have disturbed sleep throughout the night.  You are feeling sad.  You have constipation.  You have dry skin.  You have gained weight.  You are taking any new or different medicines that you suspect are causing fatigue.  You are unable to sleep at night.  You develop any unusual swelling of your legs or other parts of your body. SEEK IMMEDIATE MEDICAL CARE IF:    You are feeling confused.  Your vision is blurred.  You feel faint or pass out.  You develop severe headache.  You develop severe abdominal, pelvic, or back pain.  You develop chest pain, shortness of breath, or an irregular or fast heartbeat.  You are unable to pass a normal amount of urine.  You develop abnormal bleeding such as bleeding from the rectum or you vomit blood.  You have thoughts about harming yourself or committing suicide.  You are worried that you might harm someone else. MAKE SURE YOU:   Understand these instructions.  Will watch your condition.  Will get help right away if you are not doing well or get worse. Document Released: 02/11/2007 Document Revised: 07/09/2011 Document Reviewed: 08/18/2013 [facility] Patient Information 2015 [place], [place]. This information is not intended to replace advice given to you by your health care provider. Make sure you discuss any questions you have with your health care provider.  Muscle Cramps and Spasms Muscle cramps and spasms occur when a muscle or muscles tighten and you have no control over this tightening (involuntary muscle contraction). They are a common problem and can develop in any muscle. The most common place is in the calf muscles of the leg. Both muscle cramps and muscle spasms are involuntary muscle contractions, but they also have differences:   Muscle cramps are sporadic and painful. They may last a few seconds to a quarter of an hour. Muscle cramps are often more forceful and last longer than muscle spasms.  Muscle spasms may or may not be painful. They may also last just a few seconds or much longer. CAUSES  It is uncommon for cramps or spasms to be due to a serious underlying problem. In many cases, the cause of cramps or spasms is unknown. Some common causes are:   Overexertion.   Overuse from repetitive motions (doing the same thing over and over).   Remaining in a certain position for a long  period of time.   Improper preparation, form, or technique while performing a sport or activity.   Dehydration.   Injury.   Side effects of some medicines.   Abnormally low levels of the salts and ions in your blood (electrolytes), especially potassium and calcium. This could happen if you are taking water pills (diuretics) or you are pregnant.  Some underlying medical problems can make it more likely to develop cramps or spasms. These include, but are not limited to:   Diabetes.   Parkinson disease.   Hormone disorders, such as thyroid problems.   Alcohol abuse.   Diseases specific to muscles, joints, and bones.   Blood vessel disease where not enough blood is getting to the muscles.  HOME CARE INSTRUCTIONS   Stay well hydrated. Drink enough water and fluids to keep your urine clear or pale yellow.  It may be helpful to massage, stretch, and relax the affected muscle.  For tight or tense muscles, use a warm towel, heating pad, or hot shower water directed to the affected area.  If you are sore or have pain after a cramp or spasm, applying ice to the affected area may relieve discomfort.  Put ice in a plastic bag.  Place a towel between your skin  and the bag.  Leave the ice on for 15-20 minutes, 03-04 times a day.  Medicines used to treat a known cause of cramps or spasms may help reduce their frequency or severity. Only take over-the-counter or prescription medicines as directed by your caregiver. SEEK MEDICAL CARE IF:  Your cramps or spasms get more severe, more frequent, or do not improve over time.  MAKE SURE YOU:   Understand these instructions.  Will watch your condition.  Will get help right away if you are not doing well or get worse. Document Released: 10/06/2001 Document Revised: 08/11/2012 Document Reviewed: 04/02/2012 [facility] Patient Information 2015 [place], [place]. This information is not intended to replace advice given to you by your  health care provider. Make sure you discuss any questions you have with your health care provider.

## 2014-04-26 NOTE — ED Notes (Addendum)
Pt has cushing syndrome.  Has periods of weakness .  Has felt weak for last week and not improving.  No fever.  No vomiting.

## 2014-04-26 NOTE — ED Provider Notes (Signed)
4:07 PM Sign out [facility] PA-C, at change of shift.  Pt with hx Cushings and Hypothyroidism p/w several weeks of generalized fatigue and head to toe muscle tightness.  Pt concerned about her thyroid level as synthroid recently [place].  Pending TSH.  Plan is for discharge with symptomatic medications if needed with endocrinology follow up at [place].   6:32 PM TSH is in normal range.  Patient informed.  Plan is for d/c home.  Pt has no needs at this time.   Discussed result, findings, treatment, and follow up  with patient.  Pt given return precautions.  Pt verbalizes understanding and agrees with plan.      Results for orders placed or performed during the hospital encounter of 04/26/14  CBC with Differential  Result Value Ref Range   WBC 9.3 4.0 - 10.5 K/uL   RBC 4.99 3.87 - 5.11 MIL/uL   Hemoglobin 14.5 12.0 - 15.0 g/dL   HCT 16.144.3 09.636.0 - 04.546.0 %   MCV 88.8 78.0 - 100.0 fL   MCH 29.1 26.0 - 34.0 pg   MCHC 32.7 30.0 - 36.0 g/dL   RDW 40.914.1 81.111.5 - 91.415.5 %   Platelets 292 150 - 400 K/uL   Neutrophils Relative % 56 43 - 77 %   Neutro Abs 5.1 1.7 - 7.7 K/uL   Lymphocytes Relative 34 12 - 46 %   Lymphs Abs 3.1 0.7 - 4.0 K/uL   Monocytes Relative 7 3 - 12 %   Monocytes Absolute 0.7 0.1 - 1.0 K/uL   Eosinophils Relative 3 0 - 5 %   Eosinophils Absolute 0.3 0.0 - 0.7 K/uL   Basophils Relative 0 0 - 1 %   Basophils Absolute 0.0 0.0 - 0.1 K/uL  Comprehensive metabolic panel  Result Value Ref Range   Sodium 138 135 - 145 mmol/L   Potassium 4.1 3.5 - 5.1 mmol/L   Chloride 107 96 - 112 mEq/L   CO2 23 19 - 32 mmol/L   Glucose, Bld 153 (H) 70 - 99 mg/dL   BUN 11 6 - 23 mg/dL   Creatinine, Ser 7.820.61 0.50 - 1.10 mg/dL   Calcium 8.8 8.4 - 95.610.5 mg/dL   Total Protein 7.0 6.0 - 8.3 g/dL   Albumin 3.8 3.5 - 5.2 g/dL   AST 23 0 - 37 U/L   ALT 17 0 - 35 U/L   Alkaline Phosphatase 66 39 - 117 U/L   Total Bilirubin 0.5 0.3 - 1.2 mg/dL   GFR calc non Af Amer >90 >90 mL/min   GFR calc Af Amer >90 >90 mL/min   Anion gap 8 5 - 15  TSH  Result Value Ref Range   TSH 1.852 0.350 - 4.500 uIU/mL   No results found.    Trixie Dredgemily Aiden Helzer, PA-C 04/26/14 1835  Richardean Canalavid H Yao, MD 04/27/14 [phone]

## 2018-08-08 ENCOUNTER — Encounter (HOSPITAL_BASED_OUTPATIENT_CLINIC_OR_DEPARTMENT_OTHER): Payer: Self-pay | Admitting: *Deleted

## 2018-08-08 ENCOUNTER — Emergency Department (HOSPITAL_BASED_OUTPATIENT_CLINIC_OR_DEPARTMENT_OTHER)
Admission: EM | Admit: 2018-08-08 | Discharge: 2018-08-08 | Disposition: A | Discharge status: Home / Self Care | Payer: BLUE CROSS/BLUE SHIELD | Attending: Emergency Medicine | Admitting: Emergency Medicine

## 2018-08-08 ENCOUNTER — Other Ambulatory Visit: Payer: Self-pay

## 2018-08-08 DIAGNOSIS — E1165 Type 2 diabetes mellitus with hyperglycemia: Secondary | ICD-10-CM | POA: Diagnosis present

## 2018-08-08 DIAGNOSIS — F17210 Nicotine dependence, cigarettes, uncomplicated: Secondary | ICD-10-CM | POA: Diagnosis not present

## 2018-08-08 DIAGNOSIS — Z7984 Long term (current) use of oral hypoglycemic drugs: Secondary | ICD-10-CM | POA: Diagnosis not present

## 2018-08-08 DIAGNOSIS — Z79899 Other long term (current) drug therapy: Secondary | ICD-10-CM | POA: Insufficient documentation

## 2018-08-08 DIAGNOSIS — Z7982 Long term (current) use of aspirin: Secondary | ICD-10-CM | POA: Insufficient documentation

## 2018-08-08 DIAGNOSIS — R739 Hyperglycemia, unspecified: Secondary | ICD-10-CM

## 2018-08-08 HISTORY — DX: Type 2 diabetes mellitus without complications: E11.9

## 2018-08-08 LAB — BASIC METABOLIC PANEL
Anion gap: 9 (ref 5–15)
BUN: 8 mg/dL (ref 6–20)
CO2: 26 mmol/L (ref 22–32)
Calcium: 8.9 mg/dL (ref 8.9–10.3)
Chloride: 100 mmol/L (ref 98–111)
Creatinine, Ser: 0.56 mg/dL (ref 0.44–1.00)
GFR calc Af Amer: >60 mL/min (ref >60)
GFR calc non Af Amer: >60 mL/min (ref >60)
Glucose, Bld: 359 mg/dL — ABNORMAL HIGH (ref 70–99)
Potassium: 3.7 mmol/L (ref 3.5–5.1)
Sodium: 135 mmol/L (ref 135–145)

## 2018-08-08 LAB — CBC WITH DIFFERENTIAL/PLATELET
Abs Immature Granulocytes: 0.05 10*3/uL (ref 0.00–0.07)
Basophils Absolute: 0.1 10*3/uL (ref 0.0–0.1)
Basophils Relative: 1 %
Eosinophils Absolute: 0.4 10*3/uL (ref 0.0–0.5)
Eosinophils Relative: 3 %
HCT: 46.4 % — ABNORMAL HIGH (ref 36.0–46.0)
Hemoglobin: 14.9 g/dL (ref 12.0–15.0)
Immature Granulocytes: 0 %
Lymphocytes Relative: 29 %
Lymphs Abs: 3.5 10*3/uL (ref 0.7–4.0)
MCH: 27.4 pg (ref 26.0–34.0)
MCHC: 32.1 g/dL (ref 30.0–36.0)
MCV: 85.5 fL (ref 80.0–100.0)
Monocytes Absolute: 0.6 10*3/uL (ref 0.1–1.0)
Monocytes Relative: 5 %
Neutro Abs: 7.4 10*3/uL (ref 1.7–7.7)
Neutrophils Relative %: 62 %
Platelets: 304 10*3/uL (ref 150–400)
RBC: 5.43 MIL/uL — ABNORMAL HIGH (ref 3.87–5.11)
RDW: 13.4 % (ref 11.5–15.5)
WBC: 12 10*3/uL — ABNORMAL HIGH (ref 4.0–10.5)
nRBC: 0 % (ref 0.0–0.2)

## 2018-08-08 LAB — URINALYSIS, MICROSCOPIC (REFLEX)

## 2018-08-08 LAB — URINALYSIS, ROUTINE W REFLEX MICROSCOPIC
Bilirubin Urine: NEGATIVE
Glucose, UA: >=500 mg/dL — AB
Hgb urine dipstick: NEGATIVE
Ketones, ur: NEGATIVE mg/dL
Leukocytes,Ua: NEGATIVE
Nitrite: NEGATIVE
Protein, ur: NEGATIVE mg/dL
Specific Gravity, Urine: 1.01 (ref 1.005–1.030)
pH: 6.5 (ref 5.0–8.0)

## 2018-08-08 LAB — CBG MONITORING, ED
Glucose-Capillary: 353 mg/dL — ABNORMAL HIGH (ref 70–99)
Glucose-Capillary: 282 mg/dL — ABNORMAL HIGH (ref 70–99)

## 2018-08-08 MED ORDER — SODIUM CHLORIDE 0.9 % IV BOLUS
1000.0000 mL | Freq: Once | INTRAVENOUS | Status: AC
  Administered 2018-08-08: 1000 mL via INTRAVENOUS

## 2018-08-08 MED ORDER — LEVOTHYROXINE SODIUM 75 MCG PO TABS: 75.0000 ug | ORAL_TABLET | Freq: Every day | ORAL | 1 refills | Status: DC

## 2018-08-08 MED ORDER — INSULIN REGULAR HUMAN 100 UNIT/ML IJ SOLN
INTRAMUSCULAR | Status: DC
Start: 2018-08-08 — End: 2018-08-08
  Filled 2018-08-08: qty 1

## 2018-08-08 MED ORDER — INSULIN REGULAR HUMAN 100 UNIT/ML IJ SOLN
6.0000 [IU] | Freq: Once | INTRAMUSCULAR | Status: AC
  Administered 2018-08-08: 6 [IU] via SUBCUTANEOUS
  Filled 2018-08-08: qty 1

## 2018-08-08 MED ORDER — METFORMIN HCL 1000 MG PO TABS: 1000.0000 mg | ORAL_TABLET | Freq: Two times a day (BID) | ORAL | 1 refills | Status: DC

## 2018-08-08 NOTE — ED Notes (Signed)
ED Provider at bedside. 

## 2018-08-08 NOTE — ED Provider Notes (Signed)
[facility] EMERGENCY DEPARTMENT Provider Note   CSN: [id number] Arrival date & time: 08/08/18  1603    History   Chief Complaint Chief Complaint  Patient presents with  . Hyperglycemia    HPI Katherine Blair is a 53 y.o. female.     Patient is a 53 year old female with past medical history of type 2 diabetes, thyroid disease.  She presents today with complaints of elevated blood sugar.  She states she has felt somewhat "off" for the past few days.  She checked her blood sugar today and it was 400.  She cannot recall ever having a blood sugar this high.  She also describes some dizziness and blurry vision.  She denies any fever, chills, or coughing.  Patient is supposed to be taking metformin and Synthroid, however has been off of these for the past several months since relocating to the area and not having a primary care physician.  The history is provided by the patient.  Hyperglycemia  Blood sugar level PTA:  400 Severity:  Moderate Duration:  3 days Timing:  Constant Progression:  Worsening Chronicity:  New Context: noncompliance   Relieved by:  Nothing Ineffective treatments:  None tried Associated symptoms: blurred vision, fatigue and polyuria   Associated symptoms: no fever and no shortness of breath     Past Medical History:  Diagnosis Date  . Cushing's syndrome (HCC)   . Diabetes mellitus without complication (HCC)   . Thyroid disease     There are no active problems to display for this patient.   Past Surgical History:  Procedure Laterality Date  . BLADDER SURGERY    . CESAREAN SECTION    . DILATION AND CURETTAGE OF UTERUS    . PARTIAL HYSTERECTOMY    . TUBAL LIGATION       OB History   No obstetric history on file.      Home Medications    Prior to Admission medications   Medication Sig Start Date End Date Taking? Authorizing Provider  gabapentin (NEURONTIN) 300 MG capsule Take 300 mg by mouth at bedtime.   Yes [provider]  metFORMIN (GLUCOPHAGE) 1000 MG tablet Take 1,000 mg by mouth 2 (two) times daily with a meal.   Yes [provider]  aspirin 81 MG tablet Take 81 mg by mouth at bedtime.    [provider]  atorvastatin (LIPITOR) 20 MG tablet Take 20 mg by mouth at bedtime.    [provider]  levothyroxine (SYNTHROID, LEVOTHROID) 112 MCG tablet Take 75 mcg by mouth daily before breakfast.     [provider]  Mifepristone (KORLYM) 300 MG TABS Take 600 mg by mouth at bedtime.    [provider]  topiramate (TOPAMAX) 50 MG tablet Take 50 mg by mouth at bedtime.    [provider]  traMADol (ULTRAM) 50 MG tablet Take 50 mg by mouth at bedtime.    [provider]    Family History History reviewed. No pertinent family history.  Social History Social History   Tobacco Use  . Smoking status: Current Every Day Smoker    Packs/day: 1.00    Years: 30.00    Pack years: 30.00    Types: Cigarettes  . Smokeless tobacco: Never Used  Substance Use Topics  . Alcohol use: Yes    Comment: social  . Drug use: No     Allergies   Nsaids   Review of Systems Review of Systems  Constitutional: Positive for  fatigue. Negative for fever.  Eyes: Positive for blurred vision.  Respiratory: Negative for shortness of breath.   Endocrine: Positive for polyuria.  All other systems reviewed and are negative.    Physical Exam Updated Vital Signs BP (!) 141/55 (BP Location: Left Arm)   Pulse 85   Temp 98.4 F (36.9 C) (Oral)   Resp 18   Ht 5\' 6"  (1.676 m)   Wt 92.5 kg   SpO2 100%   BMI 32.93 kg/m   Physical Exam Vitals signs and nursing note reviewed.  Constitutional:      General: She is not in acute distress.    Appearance: She is well-developed. She is not diaphoretic.  HENT:     Head: Normocephalic and atraumatic.     Mouth/Throat:     Mouth: Mucous membranes are moist.     Pharynx: No oropharyngeal exudate or posterior oropharyngeal  erythema.  Neck:     Musculoskeletal: Normal range of motion and neck supple.  Cardiovascular:     Rate and Rhythm: Normal rate and regular rhythm.     Heart sounds: No murmur. No friction rub. No gallop.   Pulmonary:     Effort: Pulmonary effort is normal. No respiratory distress.     Breath sounds: Normal breath sounds. No wheezing.  Abdominal:     General: Bowel sounds are normal. There is no distension.     Palpations: Abdomen is soft.     Tenderness: There is no abdominal tenderness.  Musculoskeletal: Normal range of motion.  Skin:    General: Skin is warm and dry.  Neurological:     General: No focal deficit present.     Mental Status: She is alert and oriented to person, place, and time.      ED Treatments / Results  Labs (all labs ordered are listed, but only abnormal results are displayed) Labs Reviewed  CBG MONITORING, ED - Abnormal; Notable for the following components:      Result Value   Glucose-Capillary 353 (*)    All other components within normal limits  BASIC METABOLIC PANEL  CBC WITH DIFFERENTIAL/PLATELET  URINALYSIS, ROUTINE W REFLEX MICROSCOPIC  CBG MONITORING, ED    EKG None  Radiology No results found.  Procedures Procedures (including critical care time)  Medications Ordered in ED Medications  sodium chloride 0.9 % bolus 1,000 mL (has no administration in time range)  insulin regular (NOVOLIN R,HUMULIN R) 100 units/mL injection 6 Units (has no administration in time range)     Initial Impression / Assessment and Plan / ED Course  I have reviewed the triage vital signs and the nursing notes.  Pertinent labs & imaging results that were available during my care of the patient were reviewed by me and considered in my medical decision making (see chart for details).  Patient presenting with complaints of elevated blood pressure, dizziness, and urinary frequency.  Her blood sugar at home is 400.  In the ER, it was 359 and is improving with  IV fluids and insulin.  Laboratory studies show a basically normal CBC and electrolytes not reflective of DKA.  Patient will be started back on her thyroid and blood sugar medications.  She needs to obtain a primary care doctor in the area as she recently relocated here.  Final Clinical Impressions(s) / ED Diagnoses   Final diagnoses:  None    ED Discharge Orders    None       Geoffery Lyonselo, Christian Treadway, MD 08/08/18 [phone]

## 2018-08-08 NOTE — ED Triage Notes (Signed)
Pt c/o increased BS 404 at home, pt states has not had any metformin in 4 months

## 2018-08-08 NOTE — ED Notes (Signed)
Patient denies pain and is resting comfortably.  

## 2018-08-08 NOTE — Discharge Instructions (Addendum)
Resume taking metformin and levothyroxine as previously prescribed.  Follow-up with a primary care doctor locally for follow-up of your blood sugar and refills of your medications.

## 2018-08-11 MED FILL — Insulin Aspart Inj 100 Unit/ML: SUBCUTANEOUS | Qty: 0.06 | Status: AC

## 2018-11-13 ENCOUNTER — Other Ambulatory Visit: Payer: Self-pay | Admitting: Family

## 2018-11-13 DIAGNOSIS — Z20822 Contact with and (suspected) exposure to covid-19: Secondary | ICD-10-CM

## 2018-11-19 ENCOUNTER — Telehealth: Payer: Self-pay | Admitting: Family

## 2018-11-19 LAB — NOVEL CORONAVIRUS, NAA: SARS-CoV-2, NAA: DETECTED — AB

## 2018-11-19 NOTE — Telephone Encounter (Signed)
Received pt's covid-19 results which are positive.  For some reason I am listed as PCP but I have never seen this patient.  I phoned pt and she told me that she went to the testing site on her own to be tested. Reports that her symptoms started 7/15.  Patient still has a  cough. Boyfriend has been very sick. I advised pt to continue to self quarantine until 7/29. If symptom free x 3 days on 7/29 ok to lift quarantine. She is advised to go to the ER if she develops SOB or severe symptoms. She verbalizes understanding.

## 2019-01-28 ENCOUNTER — Other Ambulatory Visit: Payer: Self-pay | Admitting: *Deleted

## 2019-01-28 DIAGNOSIS — F17200 Nicotine dependence, unspecified, uncomplicated: Secondary | ICD-10-CM

## 2019-01-28 DIAGNOSIS — R911 Solitary pulmonary nodule: Secondary | ICD-10-CM

## 2019-02-04 ENCOUNTER — Inpatient Hospital Stay: Admission: RE | Admit: 2019-02-04 | Payer: BLUE CROSS/BLUE SHIELD | Source: Ambulatory Visit

## 2019-02-04 ENCOUNTER — Other Ambulatory Visit: Payer: Self-pay | Admitting: *Deleted

## 2019-02-16 ENCOUNTER — Other Ambulatory Visit: Payer: Self-pay

## 2019-02-16 ENCOUNTER — Encounter (HOSPITAL_BASED_OUTPATIENT_CLINIC_OR_DEPARTMENT_OTHER): Payer: Self-pay

## 2019-02-16 ENCOUNTER — Emergency Department (HOSPITAL_BASED_OUTPATIENT_CLINIC_OR_DEPARTMENT_OTHER)
Admission: EM | Admit: 2019-02-16 | Discharge: 2019-02-16 | Disposition: A | Discharge status: Home / Self Care | Payer: BLUE CROSS/BLUE SHIELD | Attending: Emergency Medicine | Admitting: Emergency Medicine

## 2019-02-16 ENCOUNTER — Emergency Department (HOSPITAL_BASED_OUTPATIENT_CLINIC_OR_DEPARTMENT_OTHER): Payer: BLUE CROSS/BLUE SHIELD

## 2019-02-16 DIAGNOSIS — I10 Essential (primary) hypertension: Secondary | ICD-10-CM | POA: Diagnosis not present

## 2019-02-16 DIAGNOSIS — J441 Chronic obstructive pulmonary disease with (acute) exacerbation: Secondary | ICD-10-CM | POA: Insufficient documentation

## 2019-02-16 DIAGNOSIS — R05 Cough: Secondary | ICD-10-CM | POA: Diagnosis present

## 2019-02-16 DIAGNOSIS — Z7984 Long term (current) use of oral hypoglycemic drugs: Secondary | ICD-10-CM | POA: Diagnosis not present

## 2019-02-16 DIAGNOSIS — E249 Cushing's syndrome, unspecified: Secondary | ICD-10-CM | POA: Insufficient documentation

## 2019-02-16 DIAGNOSIS — Z79899 Other long term (current) drug therapy: Secondary | ICD-10-CM | POA: Diagnosis not present

## 2019-02-16 DIAGNOSIS — F17210 Nicotine dependence, cigarettes, uncomplicated: Secondary | ICD-10-CM | POA: Diagnosis not present

## 2019-02-16 DIAGNOSIS — R059 Cough, unspecified: Secondary | ICD-10-CM

## 2019-02-16 DIAGNOSIS — E119 Type 2 diabetes mellitus without complications: Secondary | ICD-10-CM | POA: Diagnosis not present

## 2019-02-16 HISTORY — DX: Chronic obstructive pulmonary disease, unspecified: J44.9

## 2019-02-16 HISTORY — DX: Gastro-esophageal reflux disease without esophagitis: K21.9

## 2019-02-16 HISTORY — DX: Essential (primary) hypertension: I10

## 2019-02-16 LAB — CBC WITH DIFFERENTIAL/PLATELET
Abs Immature Granulocytes: 0.06 10*3/uL (ref 0.00–0.07)
Basophils Absolute: 0.1 10*3/uL (ref 0.0–0.1)
Basophils Relative: 1 %
Eosinophils Absolute: 0.4 10*3/uL (ref 0.0–0.5)
Eosinophils Relative: 3 %
HCT: 48.8 % — ABNORMAL HIGH (ref 36.0–46.0)
Hemoglobin: 15.4 g/dL — ABNORMAL HIGH (ref 12.0–15.0)
Immature Granulocytes: 1 %
Lymphocytes Relative: 34 %
Lymphs Abs: 4.4 10*3/uL — ABNORMAL HIGH (ref 0.7–4.0)
MCH: 27.2 pg (ref 26.0–34.0)
MCHC: 31.6 g/dL (ref 30.0–36.0)
MCV: 86.2 fL (ref 80.0–100.0)
Monocytes Absolute: 0.7 10*3/uL (ref 0.1–1.0)
Monocytes Relative: 6 %
Neutro Abs: 7.2 10*3/uL (ref 1.7–7.7)
Neutrophils Relative %: 55 %
Platelets: 336 10*3/uL (ref 150–400)
RBC: 5.66 MIL/uL — ABNORMAL HIGH (ref 3.87–5.11)
RDW: 13.6 % (ref 11.5–15.5)
WBC: 12.8 10*3/uL — ABNORMAL HIGH (ref 4.0–10.5)
nRBC: 0 % (ref 0.0–0.2)

## 2019-02-16 LAB — COMPREHENSIVE METABOLIC PANEL
ALT: 34 U/L (ref 0–44)
AST: 24 U/L (ref 15–41)
Albumin: 3.8 g/dL (ref 3.5–5.0)
Alkaline Phosphatase: 106 U/L (ref 38–126)
Anion gap: 13 (ref 5–15)
BUN: 12 mg/dL (ref 6–20)
CO2: 28 mmol/L (ref 22–32)
Calcium: 9.2 mg/dL (ref 8.9–10.3)
Chloride: 100 mmol/L (ref 98–111)
Creatinine, Ser: 0.47 mg/dL (ref 0.44–1.00)
GFR calc Af Amer: >60 mL/min (ref >60)
GFR calc non Af Amer: >60 mL/min (ref >60)
Glucose, Bld: 112 mg/dL — ABNORMAL HIGH (ref 70–99)
Potassium: 3.6 mmol/L (ref 3.5–5.1)
Sodium: 141 mmol/L (ref 135–145)
Total Bilirubin: 0.3 mg/dL (ref 0.3–1.2)
Total Protein: 7.2 g/dL (ref 6.5–8.1)

## 2019-02-16 LAB — LIPASE, BLOOD: Lipase: 31 U/L (ref 11–51)

## 2019-02-16 MED ORDER — DOXYCYCLINE HYCLATE 100 MG PO TABS
100.0000 mg | ORAL_TABLET | Freq: Once | ORAL | Status: AC
  Administered 2019-02-16: 100 mg via ORAL
  Filled 2019-02-16: qty 1

## 2019-02-16 MED ORDER — PREDNISONE 20 MG PO TABS: 40.0000 mg | ORAL_TABLET | Freq: Every day | ORAL | 0 refills | Status: AC

## 2019-02-16 MED ORDER — PREDNISONE 20 MG PO TABS
40.0000 mg | ORAL_TABLET | Freq: Once | ORAL | Status: AC
  Administered 2019-02-16: 21:00:00 40 mg via ORAL
  Filled 2019-02-16: qty 2

## 2019-02-16 MED ORDER — BENZONATATE 100 MG PO CAPS: 100.0000 mg | ORAL_CAPSULE | Freq: Every evening | ORAL | 0 refills | Status: DC | PRN

## 2019-02-16 MED ORDER — DOXYCYCLINE HYCLATE 100 MG PO CAPS: 100.0000 mg | ORAL_CAPSULE | Freq: Two times a day (BID) | ORAL | 0 refills | Status: AC

## 2019-02-16 MED ORDER — ALBUTEROL SULFATE HFA 108 (90 BASE) MCG/ACT IN AERS: 2.0000 | INHALATION_SPRAY | RESPIRATORY_TRACT | 1 refills | Status: DC | PRN

## 2019-02-16 NOTE — Discharge Instructions (Signed)
They were given multiple prescriptions.  Albuterol is the inhaler.  Doxycycline is the antibiotic.  Lavella Lemons is a medicine that you can take at night to help reduce your cough.  Prednisone is a steroid that will help with inflammation.  I have given you a prescription for steroids today.  Some common side effects include feelings of extra energy, feeling warm, increased appetite, and stomach upset.  If you are diabetic your sugars may run higher than usual.  Please wait 24 hours after your dose of steroids in the emergency room before you take your next dose.  You may have diarrhea from the antibiotics.  It is very important that you continue to take the antibiotics even if you get diarrhea unless a medical professional tells you that you may stop taking them.  If you stop too early the bacteria you are being treated for will become stronger and you may need different, more powerful antibiotics that have more side effects and worsening diarrhea.  Please stay well hydrated and consider probiotics as they may decrease the severity of your diarrhea.

## 2019-02-16 NOTE — ED Provider Notes (Signed)
[facility] EMERGENCY DEPARTMENT Provider Note   CSN: [id number] Arrival date & time: 02/16/19  1543     History   Chief Complaint Chief Complaint  Patient presents with  . Cough    HPI Katherine Blair is a 53 y.o. female with a past medical history of COPD, DM, Cushing's, hypertension, who presents today for evaluation of cough.  She reports that she normally has a cough that is productive with clear to white sputum, however since yesterday her cough has gotten worse and has been thick and brownish/green.  She denies any nasal congestion or postnasal drip.  She denies any headache, nausea, vomiting, or diarrhea.  She does note that she tested positive for coronavirus in July however felt like she had fully recovered.  She reports that she has a tightness across her chest primarily in the left side into her back.  She states that it hurts more when she takes a big breath in.     HPI  Past Medical History:  Diagnosis Date  . COPD (chronic obstructive pulmonary disease) (HCC)   . Cushing's syndrome (HCC)   . Diabetes mellitus without complication (HCC)   . GERD (gastroesophageal reflux disease)   . Hypertension   . Thyroid disease     There are no active problems to display for this patient.   Past Surgical History:  Procedure Laterality Date  . BLADDER SURGERY    . CESAREAN SECTION    . DILATION AND CURETTAGE OF UTERUS    . PARTIAL HYSTERECTOMY    . TUBAL LIGATION       OB History   No obstetric history on file.      Home Medications    Prior to Admission medications   Medication Sig Start Date End Date Taking? Authorizing Provider  albuterol (VENTOLIN HFA) 108 (90 Base) MCG/ACT inhaler Inhale 2 puffs into the lungs every 4 (four) hours as needed for wheezing or shortness of breath. 02/16/19   Cristina GongHammond, Elizabeth W, PA-C  aspirin 81 MG tablet Take 81 mg by mouth at bedtime.    [provider]  atorvastatin (LIPITOR) 20 MG tablet Take 20 mg by  mouth at bedtime.    [provider]  benzonatate (TESSALON) 100 MG capsule Take 1 capsule (100 mg total) by mouth at bedtime as needed for cough. 02/16/19   Cristina GongHammond, Elizabeth W, PA-C  doxycycline (VIBRAMYCIN) 100 MG capsule Take 1 capsule (100 mg total) by mouth 2 (two) times daily for 7 days. 02/16/19 02/23/19  Cristina GongHammond, Elizabeth W, PA-C  gabapentin (NEURONTIN) 300 MG capsule Take 300 mg by mouth at bedtime.    [provider]  levothyroxine (SYNTHROID, LEVOTHROID) 75 MCG tablet Take 1 tablet (75 mcg total) by mouth daily before breakfast. 08/08/18   Geoffery Lyonselo, Douglas, MD  metFORMIN (GLUCOPHAGE) 1000 MG tablet Take 1 tablet (1,000 mg total) by mouth 2 (two) times daily with a meal. 08/08/18   Geoffery Lyonselo, Douglas, MD  Mifepristone (KORLYM) 300 MG TABS Take 600 mg by mouth at bedtime.    [provider]  predniSONE (DELTASONE) 20 MG tablet Take 2 tablets (40 mg total) by mouth daily for 4 days. 02/16/19 02/20/19  Cristina GongHammond, Elizabeth W, PA-C  topiramate (TOPAMAX) 50 MG tablet Take 50 mg by mouth at bedtime.    [provider]  traMADol (ULTRAM) 50 MG tablet Take 50 mg by mouth at bedtime.    [provider]    Family History No family history on file.  Social  History Social History   Tobacco Use  . Smoking status: Current Every Day Smoker    Packs/day: 1.00    Years: 30.00    Pack years: 30.00    Types: Cigarettes  . Smokeless tobacco: Never Used  Substance Use Topics  . Alcohol use: Yes    Comment: social  . Drug use: No     Allergies   Penicillins   Review of Systems Review of Systems  Constitutional: Negative for chills, fatigue and fever.  Eyes: Negative for visual disturbance.  Respiratory: Positive for cough and chest tightness. Negative for shortness of breath.   Cardiovascular: Negative for palpitations and leg swelling.  Gastrointestinal: Negative for abdominal pain, diarrhea, nausea and vomiting.  Genitourinary: Negative for  dysuria.  Musculoskeletal: Negative for back pain.  Skin: Negative for rash.  Neurological: Negative for dizziness, weakness and headaches.  Psychiatric/Behavioral: Negative for confusion.  All other systems reviewed and are negative.    Physical Exam Updated Vital Signs BP 102/64   Pulse 87   Temp 98.9 F (37.2 C) (Oral)   Resp 14   Ht 5' 6.5" (1.689 m)   Wt 85.7 kg   SpO2 98%   BMI 30.05 kg/m   Physical Exam Vitals signs and nursing note reviewed.  Constitutional:      General: She is not in acute distress.    Appearance: She is well-developed.  HENT:     Head: Normocephalic and atraumatic.     Nose: Nose normal.     Mouth/Throat:     Mouth: Mucous membranes are moist.  Eyes:     Conjunctiva/sclera: Conjunctivae normal.  Neck:     Musculoskeletal: Neck supple.  Cardiovascular:     Rate and Rhythm: Normal rate and regular rhythm.     Pulses: Normal pulses.          Radial pulses are 2+ on the right side and 2+ on the left side.       Dorsalis pedis pulses are 2+ on the right side and 2+ on the left side.       Posterior tibial pulses are 2+ on the right side and 2+ on the left side.     Heart sounds: Normal heart sounds. No murmur.     Comments: No tenderness to palpation over anterior chest wall. Pulmonary:     Effort: Pulmonary effort is normal. No respiratory distress.     Breath sounds: Normal breath sounds. No rhonchi or rales.  Abdominal:     General: Abdomen is flat. There is no distension.     Palpations: Abdomen is soft.     Tenderness: There is no abdominal tenderness.  Musculoskeletal:     Right lower leg: No edema.     Left lower leg: No edema.  Skin:    General: Skin is warm and dry.  Neurological:     General: No focal deficit present.     Mental Status: She is alert.     Cranial Nerves: No cranial nerve deficit.  Psychiatric:        Mood and Affect: Mood normal.        Behavior: Behavior normal.      ED Treatments / Results  Labs  (all labs ordered are listed, but only abnormal results are displayed) Labs Reviewed  COMPREHENSIVE METABOLIC PANEL - Abnormal; Notable for the following components:      Result Value   Glucose, Bld 112 (*)    All other components within normal limits  CBC WITH DIFFERENTIAL/PLATELET - Abnormal; Notable for the following components:   WBC 12.8 (*)    RBC 5.66 (*)    Hemoglobin 15.4 (*)    HCT 48.8 (*)    Lymphs Abs 4.4 (*)    All other components within normal limits  LIPASE, BLOOD    EKG EKG Interpretation  Date/Time:  Monday February 16 2019 16:00:30 EDT Ventricular Rate:  100 PR Interval:  142 QRS Duration: 82 QT Interval:  334 QTC Calculation: 430 R Axis:   69 Text Interpretation:  Normal sinus rhythm Normal ECG when compared to prior, no significant changes seen.  No STEMI Confirmed by Theda Belfast ([id number]) on 02/16/2019 5:03:30 PM   Radiology Dg Chest 2 View  Result Date: 02/16/2019 CLINICAL DATA:  Worsening productive cough, chest tightness EXAM: CHEST - 2 VIEW COMPARISON:  11/13/2011 FINDINGS: Normal heart size and vascularity. Minor mid and lower lung bandlike densities compatible with atelectasis or scarring. No focal pneumonia, collapse or consolidation. Negative for edema, effusion or pneumothorax. Trachea midline. IMPRESSION: Stable exam.  No active chest disease. Electronically Signed   By: Judie Petit.  Shick M.D.   On: 02/16/2019 16:16    Procedures Procedures (including critical care time)  Medications Ordered in ED Medications  doxycycline (VIBRA-TABS) tablet 100 mg (100 mg Oral Given 02/16/19 2035)  predniSONE (DELTASONE) tablet 40 mg (40 mg Oral Given 02/16/19 2035)     Initial Impression / Assessment and Plan / ED Course  I have reviewed the triage vital signs and the nursing notes.  Pertinent labs & imaging results that were available during my care of the patient were reviewed by me and considered in my medical decision making (see chart for details).        Patient presents today for evaluation of worsening of productive cough with increased coughing, increased sputum production, and change in sputum from clear/white to dark brown/green. She has a history of COPD.  She states she has not been having any fevers. She states that she had previously been diagnosed with coronavirus in July and has fully recovered from that.  On exam lungs are clear to auscultation bilaterally.  She was able to ambulate in the department without significant desaturation.  He does not show evidence of consolidation or other acute abnormalities.  White count is slightly elevated at 12.8.  Hemoglobin is slightly elevated at 15.4, however I do not think that these are clinically significant.  CMP and lipase are unremarkable. She is not having symptoms concerning for ACS given that she has a mild discomfort in her chest primarily when she coughs that started after she had already been coughing. PE was considered, however she is not tachycardic, not hypoxic, and I would not expect PE to cause a change in sputum amount and purulence.  EKG without evidence of ischemia or other acute abnormalities.  I suspect that her symptoms are related to a acute exacerbation of COPD.  She will be treated with doxycycline, and prednisone.  She reports she has albuterol inhalers at home, however she is given prescription for extra butyryl inhalers with instructions on how and when to use them. She is given a prescription for Tessalon with instructions to only use them at night.  She states that she has close follow-up as she sees her pulmonologist on Thursday of this week.  Return precautions were discussed with patient who states their understanding.  At the time of discharge patient denied any unaddressed complaints or concerns.  Patient is agreeable for  discharge home.   Final Clinical Impressions(s) / ED Diagnoses   Final diagnoses:  Cough  COPD with acute exacerbation [facility])     ED Discharge Orders         Ordered    doxycycline (VIBRAMYCIN) 100 MG capsule  2 times daily     02/16/19 2025    predniSONE (DELTASONE) 20 MG tablet  Daily     02/16/19 2025    benzonatate (TESSALON) 100 MG capsule  At bedtime PRN     02/16/19 2025    albuterol (VENTOLIN HFA) 108 (90 Base) MCG/ACT inhaler  Every 4 hours PRN     02/16/19 2025           Cristina Gong, PA-C 02/17/19 0032    Tegeler, Canary Brim, MD 02/17/19 [phone]

## 2019-02-16 NOTE — Progress Notes (Signed)
Patient ambulated around the department while on pulse ox.  Upon return the her the the Patient's SPO2 was 97% and her HR was 104.

## 2019-02-16 NOTE — ED Triage Notes (Signed)
Pt reports a worsening cough over the weekend that has now become productive. Pt having some associated chest tightness. Denies any fever.

## 2019-02-16 NOTE — ED Notes (Signed)
Pt. Reports she has some chest pain in the L and around to the back.  Pt. Reports cough for ever but worse last night with chunks of dark sputum not normal per Pt.  Brownish green thick and sticky.

## 2019-02-16 NOTE — ED Notes (Addendum)
Pt. Was walked by Resp. Therapist  Upon return to room Pt. HR is 104 and sat on RA is 97%

## 2019-03-23 ENCOUNTER — Other Ambulatory Visit: Payer: Self-pay

## 2019-03-25 ENCOUNTER — Ambulatory Visit: Payer: BLUE CROSS/BLUE SHIELD | Admitting: Endocrinology

## 2019-03-25 ENCOUNTER — Encounter: Payer: Self-pay | Admitting: Endocrinology

## 2019-03-25 ENCOUNTER — Other Ambulatory Visit: Payer: Self-pay

## 2019-03-25 VITALS — BP 116/80 | HR 90 | Ht 66.5 in | Wt 198.6 lb

## 2019-03-25 DIAGNOSIS — Z794 Long term (current) use of insulin: Secondary | ICD-10-CM | POA: Diagnosis not present

## 2019-03-25 DIAGNOSIS — E1165 Type 2 diabetes mellitus with hyperglycemia: Secondary | ICD-10-CM

## 2019-03-25 DIAGNOSIS — E119 Type 2 diabetes mellitus without complications: Secondary | ICD-10-CM | POA: Diagnosis not present

## 2019-03-25 LAB — POCT GLYCOSYLATED HEMOGLOBIN (HGB A1C): Hemoglobin A1C: 9.4 % — AB (ref 4.0–5.6)

## 2019-03-25 MED ORDER — TOUJEO MAX SOLOSTAR 300 UNIT/ML ~~LOC~~ SOPN: 80.0000 [IU] | PEN_INJECTOR | SUBCUTANEOUS | 11 refills | Status: DC

## 2019-03-25 MED ORDER — DEXAMETHASONE 1 MG PO TABS: 1.0000 mg | ORAL_TABLET | ORAL | 0 refills | Status: DC

## 2019-03-25 NOTE — Patient Instructions (Addendum)
good diet and exercise significantly improve the control of your diabetes.  please let me know if you wish to be referred to a dietician.  high blood sugar is very risky to your health.  you should see an eye doctor and dentist every year.  It is very important to get all recommended vaccinations.  Controlling your blood pressure and cholesterol drastically reduces the damage diabetes does to your body.  Those who smoke should quit.  Please discuss these with your doctor.  check your blood sugar twice a day.  vary the time of day when you check, between before the 3 meals, and at bedtime.  also check if you have symptoms of your blood sugar being too high or too low.  please keep a record of the readings and bring it to your next appointment here (or you can bring the meter itself).  You can write it on any piece of paper.  please call us sooner if your blood sugar goes below 70, or if you have a lot of readings over 200. I have sent a prescription to your pharmacy, to change Lantus to "Toujeo," 80 units each morning. You should do a "dexamethasone suppression test."  For this, you would take dexamethasone 1 mg at 9-10 pm (I have sent a prescription to your pharmacy), then come in for a "cortisol" blood test the next morning before 9 am.  You do not need to be fasting for this test.  Please come back for a follow-up appointment in 2 months.

## 2019-03-25 NOTE — Progress Notes (Signed)
Subjective:    Patient ID: Katherine Blair, female    DOB: [birth year], 53 y.o.   MRN: [id number]  HPI pt is referred by Luciano Cutter, for diabetes.  Pt states DM was dx'ed in 2017; she has mild if any neuropathy of the lower extremities; she is unaware of any associated chronic complications; she has been on insulin since dx; pt says her diet and exercise are fair; she has never had GDM, pancreatitis, pancreatic surgery, severe hypoglycemia or DKA.  Pt says cbg varies from 125-456.  It is in general higher as the day goes on.   Pt also reports bilat adrenal adenomas and "subclinical cushing's syndrome."  She took an uncertain medication for a brief time, but not recently.  Pt says the practice she saw has since closed.   Pt requests gabapentin for leg pain. Past Medical History:  Diagnosis Date  . COPD (chronic obstructive pulmonary disease) (HCC)   . Cushing's syndrome (HCC)   . Diabetes mellitus without complication (HCC)   . GERD (gastroesophageal reflux disease)   . Hypertension   . Thyroid disease     Past Surgical History:  Procedure Laterality Date  . BLADDER SURGERY    . CESAREAN SECTION    . DILATION AND CURETTAGE OF UTERUS    . PARTIAL HYSTERECTOMY    . TUBAL LIGATION      Social History   Socioeconomic History  . Marital status: Single    Spouse name: Not on file  . Number of children: Not on file  . Years of education: Not on file  . Highest education level: Not on file  Occupational History  . Not on file  Social Needs  . Financial resource strain: Not on file  . Food insecurity    Worry: Not on file    Inability: Not on file  . Transportation needs    Medical: Not on file    Non-medical: Not on file  Tobacco Use  . Smoking status: Current Every Day Smoker    Packs/day: 1.00    Years: 30.00    Pack years: 30.00    Types: Cigarettes  . Smokeless tobacco: Never Used  Substance and Sexual Activity  . Alcohol use: Yes    Comment: social  . Drug use:  No  . Sexual activity: Not on file  Lifestyle  . Physical activity    Days per week: Not on file    Minutes per session: Not on file  . Stress: Not on file  Relationships  . Social Musician on phone: Not on file    Gets together: Not on file    Attends religious service: Not on file    Active member of club or organization: Not on file    Attends meetings of clubs or organizations: Not on file    Relationship status: Not on file  . Intimate partner violence    Fear of current or ex partner: Not on file    Emotionally abused: Not on file    Physically abused: Not on file    Forced sexual activity: Not on file  Other Topics Concern  . Not on file  Social History Narrative  . Not on file    Current Outpatient Medications on File Prior to Visit  Medication Sig Dispense Refill  . atorvastatin (LIPITOR) 20 MG tablet Take 20 mg by mouth at bedtime.    . Insulin Glargine (LANTUS SOLOSTAR) 100 UNIT/ML Solostar Pen Inject 69 Units into  the skin daily. Inject 69 units under the skin once daily.    Marland Kitchen. omeprazole (PRILOSEC) 20 MG capsule Take 20 mg by mouth daily.     No current facility-administered medications on file prior to visit.     Allergies  Allergen Reactions  . Penicillins Rash    Family History  Problem Relation Age of Onset  . Diabetes Mother   . Diabetes Father   . Diabetes Sister     BP 116/80 (BP Location: Left Arm, Patient Position: Sitting, Cuff Size: Large)   Pulse 90   Ht 5' 6.5" (1.689 m)   Wt 198 lb 9.6 oz (90.1 kg)   SpO2 97%   BMI 31.57 kg/m    Review of Systems denies weight loss, blurry vision, headache, chest pain, n/v, urinary frequency, excessive diaphoresis, memory loss, depression, cold intolerance, rhinorrhea, and easy bruising. She has dry mouth, doe, leg cramps, and fatigue.       Objective:   Physical Exam VS: see vs page GEN: no distress HEAD: head: no deformity eyes: no periorbital swelling, no proptosis.   external  nose and ears are normal NECK: supple, thyroid is not enlarged CHEST WALL: no deformity LUNGS: clear to auscultation CV: reg rate and rhythm, no murmur ABD: abdomen is soft, nontender.  no hepatosplenomegaly.  not distended.  no hernia MUSCULOSKELETAL: muscle bulk and strength are grossly normal.  no obvious joint swelling.  gait is normal and steady EXTEMITIES: no deformity.  no ulcer on the feet.  feet are of normal color and temp.  no edema PULSES: dorsalis pedis intact bilat.  no carotid bruit NEURO:  cn 2-12 grossly intact.   readily moves all 4's.  sensation is intact to touch on the feet SKIN:  Normal texture and temperature.  No rash or suspicious lesion is visible.   NODES:  None palpable at the neck PSYCH: alert, well-oriented.  Does not appear anxious nor depressed.   Lab Results  Component Value Date   HGBA1C 9.4 (A) 03/25/2019   [facility]  Lab Results  Component Value Date   CREATININE 0.47 02/16/2019   BUN 12 02/16/2019   NA 141 02/16/2019   K 3.6 02/16/2019   CL 100 02/16/2019   CO2 28 02/16/2019       Assessment & Plan:  Insulin-requiring type 2 DM, new to me: she needs increased rx.  Leg pain: pt is advised rx of chronic pain is outside the scope of our practice. subclinical cushing's syndrome: recheck ON dex supp test  Patient Instructions  good diet and exercise significantly improve the control of your diabetes.  please let me know if you wish to be referred to a dietician.  high blood sugar is very risky to your health.  you should see an eye doctor and dentist every year.  It is very important to get all recommended vaccinations.  Controlling your blood pressure and cholesterol drastically reduces the damage diabetes does to your body.  Those who smoke should quit.  Please discuss these with your doctor.  check your blood sugar twice a day.  vary the time of day when you check, between before the 3 meals, and at bedtime.  also check if  you have symptoms of your blood sugar being too high or too low.  please keep a record of the readings and bring it to your next appointment here (or you can bring the meter itself).  You can write it on any piece of  paper.  please call us sooner if your blood sugar goes below 70, or if you have a lot of readings over 200. I have sent a prescription to your pharmacy, to change Lantus to "Toujeo," 80 units each morning. You should do a "dexamethasone suppression test."  For this, you would take dexamethasone 1 mg at 9-10 pm (I have sent a prescription to your pharmacy), then come in for a "cortisol" blood test the next morning before 9 am.  You do not need to be fasting for this test.  Please come back for a follow-up appointment in 2 months.

## 2019-03-28 DIAGNOSIS — E119 Type 2 diabetes mellitus without complications: Secondary | ICD-10-CM | POA: Insufficient documentation

## 2019-03-31 ENCOUNTER — Other Ambulatory Visit (INDEPENDENT_AMBULATORY_CARE_PROVIDER_SITE_OTHER): Payer: BLUE CROSS/BLUE SHIELD

## 2019-03-31 ENCOUNTER — Other Ambulatory Visit: Payer: Self-pay

## 2019-03-31 DIAGNOSIS — Z794 Long term (current) use of insulin: Secondary | ICD-10-CM

## 2019-03-31 DIAGNOSIS — E1165 Type 2 diabetes mellitus with hyperglycemia: Secondary | ICD-10-CM

## 2019-03-31 LAB — TSH: TSH: 2.45 u[IU]/mL (ref 0.35–4.50)

## 2019-03-31 LAB — CORTISOL: Cortisol, Plasma: 1.4 ug/dL

## 2019-04-16 DIAGNOSIS — K22711 Barrett's esophagus with high grade dysplasia: Secondary | ICD-10-CM | POA: Diagnosis present

## 2019-05-27 ENCOUNTER — Encounter: Payer: Self-pay | Admitting: Endocrinology

## 2019-05-27 ENCOUNTER — Ambulatory Visit (INDEPENDENT_AMBULATORY_CARE_PROVIDER_SITE_OTHER): Payer: BLUE CROSS/BLUE SHIELD | Admitting: Endocrinology

## 2019-05-27 ENCOUNTER — Other Ambulatory Visit: Payer: Self-pay

## 2019-05-27 VITALS — BP 112/64 | HR 111 | Ht 66.5 in | Wt 203.0 lb

## 2019-05-27 DIAGNOSIS — Z8639 Personal history of other endocrine, nutritional and metabolic disease: Secondary | ICD-10-CM | POA: Diagnosis not present

## 2019-05-27 DIAGNOSIS — Z794 Long term (current) use of insulin: Secondary | ICD-10-CM

## 2019-05-27 DIAGNOSIS — E1165 Type 2 diabetes mellitus with hyperglycemia: Secondary | ICD-10-CM

## 2019-05-27 LAB — POCT GLYCOSYLATED HEMOGLOBIN (HGB A1C): Hemoglobin A1C: 9 % — AB (ref 4.0–5.6)

## 2019-05-27 MED ORDER — TOUJEO MAX SOLOSTAR 300 UNIT/ML ~~LOC~~ SOPN: 95.0000 [IU] | PEN_INJECTOR | SUBCUTANEOUS | 11 refills | Status: DC

## 2019-05-27 NOTE — Progress Notes (Signed)
Subjective:    Patient ID: Katherine Blair, female    DOB: 1966-02-01, 54 y.o.   MRN: [id number]  HPI Pt returns for f/u of diabetes mellitus: DM type: Insulin-requiring type 2 Dx'ed: 4166 Complications: none Therapy: insulin since dx GDM: never DKA: never Severe hypoglycemia: never Pancreatitis: never Pancreatic imaging: normal on 2017 CT SDOH: none Other: she takes qd insulin, at least for now.  Interval history: no cbg record, but states cbg's varies from 112-345.  It has been lowest due to limited diet the past few days, for GI procedure.   Past Medical History:  Diagnosis Date  . COPD (chronic obstructive pulmonary disease) ([employer])   . Cushing's syndrome ([place])   . Diabetes mellitus without complication ([place])   . GERD (gastroesophageal reflux disease)   . Hypertension   . Thyroid disease     Past Surgical History:  Procedure Laterality Date  . BLADDER SURGERY    . CESAREAN SECTION    . DILATION AND CURETTAGE OF UTERUS    . PARTIAL HYSTERECTOMY    . TUBAL LIGATION      Social History   Socioeconomic History  . Marital status: Single    Spouse name: Not on file  . Number of children: Not on file  . Years of education: Not on file  . Highest education level: Not on file  Occupational History  . Not on file  Tobacco Use  . Smoking status: Current Every Day Smoker    Packs/day: 1.00    Years: 30.00    Pack years: 30.00    Types: Cigarettes  . Smokeless tobacco: Never Used  Substance and Sexual Activity  . Alcohol use: Yes    Comment: social  . Drug use: No  . Sexual activity: Not on file  Other Topics Concern  . Not on file  Social History Narrative  . Not on file   Social Determinants of Health   Financial Resource Strain:   . Difficulty of Paying Living Expenses: Not on file  Food Insecurity:   . Worried About Charity fundraiser in the Last Year: Not on file  . Ran Out of Food in the Last Year: Not on file  Transportation Needs:   . Lack of  Transportation (Medical): Not on file  . Lack of Transportation (Non-Medical): Not on file  Physical Activity:   . Days of Exercise per Week: Not on file  . Minutes of Exercise per Session: Not on file  Stress:   . Feeling of Stress : Not on file  Social Connections:   . Frequency of Communication with Friends and Family: Not on file  . Frequency of Social Gatherings with Friends and Family: Not on file  . Attends Religious Services: Not on file  . Active Member of Clubs or Organizations: Not on file  . Attends Archivist Meetings: Not on file  . Marital Status: Not on file  Intimate Partner Violence:   . Fear of Current or Ex-Partner: Not on file  . Emotionally Abused: Not on file  . Physically Abused: Not on file  . Sexually Abused: Not on file    Current Outpatient Medications on File Prior to Visit  Medication Sig Dispense Refill  . atorvastatin (LIPITOR) 20 MG tablet Take 20 mg by mouth at bedtime.    Marland Kitchen omeprazole (PRILOSEC) 20 MG capsule Take 20 mg by mouth daily.     No current facility-administered medications on file prior to visit.    Allergies  Allergen Reactions  . Penicillins Rash    Family History  Problem Relation Age of Onset  . Diabetes Mother   . Diabetes Father   . Diabetes Sister     BP 112/64 (BP Location: Left Arm, Patient Position: Sitting, Cuff Size: Large)   Pulse (!) 111   Ht 5' 6.5" (1.689 m)   Wt 203 lb (92.1 kg)   SpO2 94%   BMI 32.27 kg/m   Review of Systems She denies hypoglycemia.     Objective:   Physical Exam VITAL SIGNS:  See vs page GENERAL: no distress Pulses: dorsalis pedis intact bilat.   MSK: no deformity of the feet CV: no leg edema Skin:  no ulcer on the feet.  normal color and temp on the feet. Neuro: sensation is intact to touch on the feet.   Lab Results  Component Value Date   TSH 2.45 03/31/2019    A1c=9.0%    Assessment & Plan:  Tachycardia: not thyroid-related Insulin-requiring type 2 DM:  she needs increased rx.   Patient Instructions  check your blood sugar twice a day.  vary the time of day when you check, between before the 3 meals, and at bedtime.  also check if you have symptoms of your blood sugar being too high or too low.  please keep a record of the readings and bring it to your next appointment here (or you can bring the meter itself).  You can write it on any piece of paper.  please call us sooner if your blood sugar goes below 70, or if you have a lot of readings over 200. Please increase the Toujeo to 95 units each morning.   On this type of insulin schedule, you should eat meals on a regular schedule.  If a meal is missed or significantly delayed, your blood sugar could go low.   Please come back for a follow-up appointment in 2 months.

## 2019-05-27 NOTE — Patient Instructions (Addendum)
check your blood sugar twice a day.  vary the time of day when you check, between before the 3 meals, and at bedtime.  also check if you have symptoms of your blood sugar being too high or too low.  please keep a record of the readings and bring it to your next appointment here (or you can bring the meter itself).  You can write it on any piece of paper.  please call us sooner if your blood sugar goes below 70, or if you have a lot of readings over 200. Please increase the Toujeo to 95 units each morning.   On this type of insulin schedule, you should eat meals on a regular schedule.  If a meal is missed or significantly delayed, your blood sugar could go low.   Please come back for a follow-up appointment in 2 months.

## 2019-07-20 ENCOUNTER — Other Ambulatory Visit: Payer: Self-pay

## 2019-07-22 ENCOUNTER — Encounter: Payer: Self-pay | Admitting: Endocrinology

## 2019-07-22 ENCOUNTER — Telehealth: Payer: Self-pay

## 2019-07-22 ENCOUNTER — Other Ambulatory Visit: Payer: Self-pay

## 2019-07-22 ENCOUNTER — Ambulatory Visit (INDEPENDENT_AMBULATORY_CARE_PROVIDER_SITE_OTHER): Payer: BLUE CROSS/BLUE SHIELD | Admitting: Endocrinology

## 2019-07-22 VITALS — BP 130/80 | HR 114 | Ht 66.5 in | Wt 206.8 lb

## 2019-07-22 DIAGNOSIS — Z794 Long term (current) use of insulin: Secondary | ICD-10-CM

## 2019-07-22 DIAGNOSIS — E1165 Type 2 diabetes mellitus with hyperglycemia: Secondary | ICD-10-CM

## 2019-07-22 DIAGNOSIS — E119 Type 2 diabetes mellitus without complications: Secondary | ICD-10-CM

## 2019-07-22 LAB — POCT GLYCOSYLATED HEMOGLOBIN (HGB A1C): Hemoglobin A1C: 8.8 % — AB (ref 4.0–5.6)

## 2019-07-22 MED ORDER — TOUJEO MAX SOLOSTAR 300 UNIT/ML ~~LOC~~ SOPN: 95.0000 [IU] | PEN_INJECTOR | SUBCUTANEOUS | 11 refills | Status: DC

## 2019-07-22 MED ORDER — TRULICITY 0.75 MG/0.5ML ~~LOC~~ SOAJ: 0.7500 mg | SUBCUTANEOUS | 11 refills | Status: DC

## 2019-07-22 NOTE — Patient Instructions (Addendum)
check your blood sugar twice a day.  vary the time of day when you check, between before the 3 meals, and at bedtime.  also check if you have symptoms of your blood sugar being too high or too low.  please keep a record of the readings and bring it to your next appointment here (or you can bring the meter itself).  You can write it on any piece of paper.  please call us sooner if your blood sugar goes below 70, or if you have a lot of readings over 200. Please continue the sameToujeo.   I have sent a prescription to your pharmacy, to add "Trulicity." On this type of insulin schedule, you should eat meals on a regular schedule.  If a meal is missed or significantly delayed, your blood sugar could go low.   Please come back for a follow-up appointment in 2 months.   In the meantime, please call if you have no nausea, so we can increase the Trulicity.  Please check the blood sugar, so we can adjust the insulin, too.

## 2019-07-22 NOTE — Telephone Encounter (Signed)
PRIOR AUTHORIZATION  PA initiation date: 07/22/19  Medication: Trulicity 0.75mg /0.62mL Insurance Company: [place] [employer] Submission completed electronically through [employer] My Meds: Yes  Will await insurance response re: approval/denial.  APPROVAL  Medication: Trulicity 0.75mg /0.66mL Insurance Company: [place] [employer] PA response: APPROVED Approval dates: 07/22/19 through 07/21/20  Documents have been labeled and placed in scan file for HIM and for our future reference.  [name] (Key: [id number])  This request has received a Favorable outcome.  Please note any additional information provided by [employer] at the bottom of this request.  [name] Key: [id number] - PA Case ID: [id number] Need help? Call us at [phone] Outcome Approvedtoday PA Case: [id number], Status: Approved, Coverage Starts on: 07/22/2019 12:00:00 AM, Coverage Ends on: 07/21/2020 12:00:00 AM. Drug Trulicity 0.75MG /0.5ML pen-injectors Form Photographer PA Form [phone] NCPDP)

## 2019-07-22 NOTE — Progress Notes (Signed)
Subjective:    Patient ID: Katherine Blair, female    DOB: July 20, 1965, 54 y.o.   MRN: [id number]  HPI Pt returns for f/u of diabetes mellitus: DM type: Insulin-requiring type 2 Dx'ed: 2017 Complications: none Therapy: insulin since dx GDM: never DKA: never Severe hypoglycemia: never Pancreatitis: never Pancreatic imaging: normal on 2017 CT.  SDOH: none Other: she takes qd insulin, at least for now.  Interval history: pt says she seldom checks cbg.  She takes insulin as rx'ed.  pt states she feels well in general. Past Medical History:  Diagnosis Date  . COPD (chronic obstructive pulmonary disease) (HCC)   . Cushing's syndrome (HCC)   . Diabetes mellitus without complication (HCC)   . GERD (gastroesophageal reflux disease)   . Hypertension   . Thyroid disease     Past Surgical History:  Procedure Laterality Date  . BLADDER SURGERY    . CESAREAN SECTION    . DILATION AND CURETTAGE OF UTERUS    . PARTIAL HYSTERECTOMY    . TUBAL LIGATION      Social History   Socioeconomic History  . Marital status: Single    Spouse name: Not on file  . Number of children: Not on file  . Years of education: Not on file  . Highest education level: Not on file  Occupational History  . Not on file  Tobacco Use  . Smoking status: Current Every Day Smoker    Packs/day: 1.00    Years: 30.00    Pack years: 30.00    Types: Cigarettes  . Smokeless tobacco: Never Used  Substance and Sexual Activity  . Alcohol use: Yes    Comment: social  . Drug use: No  . Sexual activity: Not on file  Other Topics Concern  . Not on file  Social History Narrative  . Not on file   Social Determinants of Health   Financial Resource Strain:   . Difficulty of Paying Living Expenses:   Food Insecurity:   . Worried About Programme researcher, broadcasting/film/video in the Last Year:   . Barista in the Last Year:   Transportation Needs:   . Freight forwarder (Medical):   Marland Kitchen Lack of Transportation (Non-Medical):     Physical Activity:   . Days of Exercise per Week:   . Minutes of Exercise per Session:   Stress:   . Feeling of Stress :   Social Connections:   . Frequency of Communication with Friends and Family:   . Frequency of Social Gatherings with Friends and Family:   . Attends Religious Services:   . Active Member of Clubs or Organizations:   . Attends Banker Meetings:   Marland Kitchen Marital Status:   Intimate Partner Violence:   . Fear of Current or Ex-Partner:   . Emotionally Abused:   Marland Kitchen Physically Abused:   . Sexually Abused:     Current Outpatient Medications on File Prior to Visit  Medication Sig Dispense Refill  . atorvastatin (LIPITOR) 20 MG tablet Take 20 mg by mouth at bedtime.    Marland Kitchen omeprazole (PRILOSEC) 20 MG capsule Take 20 mg by mouth daily.     No current facility-administered medications on file prior to visit.    Allergies  Allergen Reactions  . Penicillins Rash    Family History  Problem Relation Age of Onset  . Diabetes Mother   . Diabetes Father   . Diabetes Sister     BP 130/80 (BP Location: Left  Arm, Patient Position: Sitting, Cuff Size: Normal)   Pulse (!) 114   Ht 5' 6.5" (1.689 m)   Wt 206 lb 12.8 oz (93.8 kg)   SpO2 96%   BMI 32.88 kg/m    Review of Systems She has weight gain    Objective:   Physical Exam VITAL SIGNS:  See vs page GENERAL: no distress Pulses: dorsalis pedis intact bilat.   MSK: no deformity of the feet CV: 1+ bilat leg edema Skin:  no ulcer on the feet.  normal color and temp on the feet. Neuro: sensation is intact to touch on the feet  Lab Results  Component Value Date   HGBA1C 8.8 (A) 07/22/2019   Lab Results  Component Value Date   TSH 2.45 03/31/2019       Assessment & Plan:  Insulin-requiring type 2 DM: she needs increased rx.  obesity: worse. Trulicity might help.   Patient Instructions  check your blood sugar twice a day.  vary the time of day when you check, between before the 3 meals, and at  bedtime.  also check if you have symptoms of your blood sugar being too high or too low.  please keep a record of the readings and bring it to your next appointment here (or you can bring the meter itself).  You can write it on any piece of paper.  please call us sooner if your blood sugar goes below 70, or if you have a lot of readings over 200. Please continue the sameToujeo.   I have sent a prescription to your pharmacy, to add "Trulicity." On this type of insulin schedule, you should eat meals on a regular schedule.  If a meal is missed or significantly delayed, your blood sugar could go low.   Please come back for a follow-up appointment in 2 months.   In the meantime, please call if you have no nausea, so we can increase the Trulicity.  Please check the blood sugar, so we can adjust the insulin, too.

## 2019-08-20 ENCOUNTER — Telehealth: Payer: Self-pay | Admitting: Endocrinology

## 2019-08-20 MED ORDER — TRULICITY 1.5 MG/0.5ML ~~LOC~~ SOAJ: 1.5000 mg | SUBCUTANEOUS | 11 refills | Status: DC

## 2019-08-20 NOTE — Telephone Encounter (Signed)
I have sent a prescription to your pharmacy, to double the Trulicity I'll see you next time.   In the meantime, please call if you have no nausea, so we can increase the Trulicity again.  Please check the blood sugar, so we can adjust the insulin, too.

## 2019-08-20 NOTE — Telephone Encounter (Signed)
Wanting to clarify with you. Were you planning to "increase" dosage OR are you planning to keep same dosage?

## 2019-08-20 NOTE — Telephone Encounter (Signed)
Outpatient Medication Detail   Disp Refills Start End   Dulaglutide (TRULICITY) 1.5 MG/0.5ML SOPN 4 pen 11 08/20/2019    Sig - Route: Inject 1.5 mg into the skin once a week. - Subcutaneous   Sent to pharmacy as: Dulaglutide (TRULICITY) 1.5 MG/0.5ML Solution Pen-injector   E-Prescribing Status: Receipt confirmed by pharmacy (08/20/2019  4:31 PM EDT)

## 2019-08-20 NOTE — Telephone Encounter (Signed)
Patient called to advised that the Trulicity had not caused her any of the side effects as discussed with Dr Everardo All.  Patient requested that new RX with the newer, increased dose can be called into her pharmacy.  Pharmacy is [facility] on [address]

## 2019-09-22 ENCOUNTER — Other Ambulatory Visit: Payer: Self-pay

## 2019-09-24 ENCOUNTER — Ambulatory Visit (INDEPENDENT_AMBULATORY_CARE_PROVIDER_SITE_OTHER): Payer: BLUE CROSS/BLUE SHIELD | Admitting: Endocrinology

## 2019-09-24 ENCOUNTER — Other Ambulatory Visit: Payer: Self-pay

## 2019-09-24 ENCOUNTER — Encounter: Payer: Self-pay | Admitting: Endocrinology

## 2019-09-24 VITALS — BP 118/88 | HR 110 | Ht 66.5 in | Wt 197.0 lb

## 2019-09-24 DIAGNOSIS — Z794 Long term (current) use of insulin: Secondary | ICD-10-CM

## 2019-09-24 DIAGNOSIS — E1165 Type 2 diabetes mellitus with hyperglycemia: Secondary | ICD-10-CM

## 2019-09-24 DIAGNOSIS — E119 Type 2 diabetes mellitus without complications: Secondary | ICD-10-CM

## 2019-09-24 LAB — POCT GLYCOSYLATED HEMOGLOBIN (HGB A1C): Hemoglobin A1C: 6.7 % — AB (ref 4.0–5.6)

## 2019-09-24 MED ORDER — TRULICITY 3 MG/0.5ML ~~LOC~~ SOAJ: 3.0000 mg | SUBCUTANEOUS | 11 refills | Status: DC

## 2019-09-24 MED ORDER — TOUJEO MAX SOLOSTAR 300 UNIT/ML ~~LOC~~ SOPN: 50.0000 [IU] | PEN_INJECTOR | SUBCUTANEOUS | 11 refills | Status: DC

## 2019-09-24 MED ORDER — PEN NEEDLES 32G X 5 MM MISC: 1.0000 | Freq: Every day | 3 refills | Status: DC

## 2019-09-24 NOTE — Progress Notes (Signed)
Subjective:    Patient ID: Katherine Blair, female    DOB: 05/15/1965, 54 y.o.   MRN: [id number]  HPI Pt returns for f/u of diabetes mellitus: DM type: Insulin-requiring type 2 Dx'ed: 8841 Complications: none Therapy: insulin since dx, and Trulicity GDM: never DKA: never Severe hypoglycemia: never Pancreatitis: never Pancreatic imaging: normal on 2017 CT.  SDOH: none Other: she takes qd insulin, at least for now.  Interval history:  She takes meds as rx'ed.  pt states she feels well in general.  She reduced Toujeo to 75 units qd, due to hypoglycemia.   Past Medical History:  Diagnosis Date  . COPD (chronic obstructive pulmonary disease) ([place])   . Cushing's syndrome ([place])   . Diabetes mellitus without complication ([place])   . GERD (gastroesophageal reflux disease)   . Hypertension   . Thyroid disease     Past Surgical History:  Procedure Laterality Date  . BLADDER SURGERY    . CESAREAN SECTION    . DILATION AND CURETTAGE OF UTERUS    . PARTIAL HYSTERECTOMY    . TUBAL LIGATION      Social History   Socioeconomic History  . Marital status: Single    Spouse name: Not on file  . Number of children: Not on file  . Years of education: Not on file  . Highest education level: Not on file  Occupational History  . Not on file  Tobacco Use  . Smoking status: Current Every Day Smoker    Packs/day: 1.00    Years: 30.00    Pack years: 30.00    Types: Cigarettes  . Smokeless tobacco: Never Used  Substance and Sexual Activity  . Alcohol use: Yes    Comment: social  . Drug use: No  . Sexual activity: Not on file  Other Topics Concern  . Not on file  Social History Narrative  . Not on file   Social Determinants of Health   Financial Resource Strain:   . Difficulty of Paying Living Expenses:   Food Insecurity:   . Worried About Charity fundraiser in the Last Year:   . Arboriculturist in the Last Year:   Transportation Needs:   . Film/video editor (Medical):   Marland Kitchen  Lack of Transportation (Non-Medical):   Physical Activity:   . Days of Exercise per Week:   . Minutes of Exercise per Session:   Stress:   . Feeling of Stress :   Social Connections:   . Frequency of Communication with Friends and Family:   . Frequency of Social Gatherings with Friends and Family:   . Attends Religious Services:   . Active Member of Clubs or Organizations:   . Attends Archivist Meetings:   Marland Kitchen Marital Status:   Intimate Partner Violence:   . Fear of Current or Ex-Partner:   . Emotionally Abused:   Marland Kitchen Physically Abused:   . Sexually Abused:     Current Outpatient Medications on File Prior to Visit  Medication Sig Dispense Refill  . atorvastatin (LIPITOR) 20 MG tablet Take 20 mg by mouth at bedtime.    Marland Kitchen omeprazole (PRILOSEC) 20 MG capsule Take 20 mg by mouth daily.     No current facility-administered medications on file prior to visit.    Allergies  Allergen Reactions  . Penicillins Rash    Family History  Problem Relation Age of Onset  . Diabetes Mother   . Diabetes Father   . Diabetes Sister  BP 118/88   Pulse (!) 110   Ht 5' 6.5" (1.689 m)   Wt 197 lb (89.4 kg)   SpO2 96%   BMI 31.32 kg/m    Review of Systems     Objective:   Physical Exam VITAL SIGNS:  See vs page GENERAL: no distress Pulses: dorsalis pedis intact bilat.   MSK: no deformity of the feet CV: no leg edema.   Skin:  no ulcer on the feet.  normal color and temp on the feet. Neuro: sensation is intact to touch on the feet.    Lab Results  Component Value Date   HGBA1C 6.7 (A) 09/24/2019   Lab Results  Component Value Date   TSH 2.45 03/31/2019       Assessment & Plan:  Insulin-requiring type 2 DM.  Hypoglycemia, due to insulin.  Tachycardia, not thyroid-related. rehceck next time  Patient Instructions  check your blood sugar twice a day.  vary the time of day when you check, between before the 3 meals, and at bedtime.  also check if you have  symptoms of your blood sugar being too high or too low.  please keep a record of the readings and bring it to your next appointment here (or you can bring the meter itself).  You can write it on any piece of paper.  please call us sooner if your blood sugar goes below 70, or if you have a lot of readings over 200. Please reduce the Toujeo to 50 units each morning.   I have sent a prescription to your pharmacy, to increase the Trulicity.  On this type of insulin schedule, you should eat meals on a regular schedule.  If a meal is missed or significantly delayed, your blood sugar could go low.   Please come back for a follow-up appointment in 2 months.

## 2019-09-24 NOTE — Patient Instructions (Addendum)
check your blood sugar twice a day.  vary the time of day when you check, between before the 3 meals, and at bedtime.  also check if you have symptoms of your blood sugar being too high or too low.  please keep a record of the readings and bring it to your next appointment here (or you can bring the meter itself).  You can write it on any piece of paper.  please call us sooner if your blood sugar goes below 70, or if you have a lot of readings over 200. Please reduce the Toujeo to 50 units each morning.   I have sent a prescription to your pharmacy, to increase the Trulicity.  On this type of insulin schedule, you should eat meals on a regular schedule.  If a meal is missed or significantly delayed, your blood sugar could go low.   Please come back for a follow-up appointment in 2 months.

## 2019-11-26 ENCOUNTER — Ambulatory Visit: Payer: BLUE CROSS/BLUE SHIELD | Admitting: Endocrinology

## 2019-12-03 ENCOUNTER — Encounter (HOSPITAL_BASED_OUTPATIENT_CLINIC_OR_DEPARTMENT_OTHER): Payer: Self-pay | Admitting: *Deleted

## 2019-12-03 ENCOUNTER — Other Ambulatory Visit: Payer: Self-pay

## 2019-12-03 ENCOUNTER — Emergency Department (HOSPITAL_BASED_OUTPATIENT_CLINIC_OR_DEPARTMENT_OTHER)
Admission: EM | Admit: 2019-12-03 | Discharge: 2019-12-04 | Disposition: A | Discharge status: Home / Self Care | Payer: PRIVATE HEALTH INSURANCE | Attending: Emergency Medicine | Admitting: Emergency Medicine

## 2019-12-03 DIAGNOSIS — E119 Type 2 diabetes mellitus without complications: Secondary | ICD-10-CM | POA: Diagnosis not present

## 2019-12-03 DIAGNOSIS — I10 Essential (primary) hypertension: Secondary | ICD-10-CM | POA: Insufficient documentation

## 2019-12-03 DIAGNOSIS — Z794 Long term (current) use of insulin: Secondary | ICD-10-CM | POA: Insufficient documentation

## 2019-12-03 DIAGNOSIS — K219 Gastro-esophageal reflux disease without esophagitis: Secondary | ICD-10-CM | POA: Insufficient documentation

## 2019-12-03 DIAGNOSIS — R109 Unspecified abdominal pain: Secondary | ICD-10-CM | POA: Diagnosis present

## 2019-12-03 DIAGNOSIS — F17210 Nicotine dependence, cigarettes, uncomplicated: Secondary | ICD-10-CM | POA: Diagnosis not present

## 2019-12-03 DIAGNOSIS — J449 Chronic obstructive pulmonary disease, unspecified: Secondary | ICD-10-CM | POA: Insufficient documentation

## 2019-12-03 LAB — URINALYSIS, ROUTINE W REFLEX MICROSCOPIC
Bilirubin Urine: NEGATIVE
Glucose, UA: NEGATIVE mg/dL
Hgb urine dipstick: NEGATIVE
Ketones, ur: NEGATIVE mg/dL
Nitrite: POSITIVE — AB
Protein, ur: 100 mg/dL — AB
Specific Gravity, Urine: >1.03 — ABNORMAL HIGH (ref 1.005–1.030)
pH: 6 (ref 5.0–8.0)

## 2019-12-03 LAB — COMPREHENSIVE METABOLIC PANEL
ALT: 25 U/L (ref 0–44)
AST: 20 U/L (ref 15–41)
Albumin: 4.1 g/dL (ref 3.5–5.0)
Alkaline Phosphatase: 103 U/L (ref 38–126)
Anion gap: 13 (ref 5–15)
BUN: 12 mg/dL (ref 6–20)
CO2: 25 mmol/L (ref 22–32)
Calcium: 9.1 mg/dL (ref 8.9–10.3)
Chloride: 99 mmol/L (ref 98–111)
Creatinine, Ser: 0.74 mg/dL (ref 0.44–1.00)
GFR calc Af Amer: >60 mL/min (ref >60)
GFR calc non Af Amer: >60 mL/min (ref >60)
Glucose, Bld: 163 mg/dL — ABNORMAL HIGH (ref 70–99)
Potassium: 3.6 mmol/L (ref 3.5–5.1)
Sodium: 137 mmol/L (ref 135–145)
Total Bilirubin: 0.2 mg/dL — ABNORMAL LOW (ref 0.3–1.2)
Total Protein: 8.7 g/dL — ABNORMAL HIGH (ref 6.5–8.1)

## 2019-12-03 LAB — CBC
HCT: 53.9 % — ABNORMAL HIGH (ref 36.0–46.0)
Hemoglobin: 17.1 g/dL — ABNORMAL HIGH (ref 12.0–15.0)
MCH: 26.3 pg (ref 26.0–34.0)
MCHC: 31.7 g/dL (ref 30.0–36.0)
MCV: 83.1 fL (ref 80.0–100.0)
Platelets: 335 10*3/uL (ref 150–400)
RBC: 6.49 MIL/uL — ABNORMAL HIGH (ref 3.87–5.11)
RDW: 15.3 % (ref 11.5–15.5)
WBC: 18.7 10*3/uL — ABNORMAL HIGH (ref 4.0–10.5)
nRBC: 0 % (ref 0.0–0.2)

## 2019-12-03 LAB — URINALYSIS, MICROSCOPIC (REFLEX)

## 2019-12-03 LAB — LIPASE, BLOOD: Lipase: 37 U/L (ref 11–51)

## 2019-12-03 MED ORDER — SODIUM CHLORIDE 0.9% FLUSH
3.0000 mL | Freq: Once | INTRAVENOUS | Status: DC
  Filled 2019-12-03: qty 3

## 2019-12-03 NOTE — ED Notes (Signed)
Lower abd pain on left & right side that sometimes radiates to lower pelvic area. Some nausea and small amounts of diarrhea, describes pain as "being in labor"chronic COPD/cough.

## 2019-12-03 NOTE — ED Triage Notes (Signed)
Abdominal pain with bloating. Pain feels like labor contractions.

## 2019-12-04 ENCOUNTER — Emergency Department (HOSPITAL_BASED_OUTPATIENT_CLINIC_OR_DEPARTMENT_OTHER): Payer: PRIVATE HEALTH INSURANCE

## 2019-12-04 ENCOUNTER — Encounter (HOSPITAL_BASED_OUTPATIENT_CLINIC_OR_DEPARTMENT_OTHER): Payer: Self-pay

## 2019-12-04 DIAGNOSIS — R109 Unspecified abdominal pain: Secondary | ICD-10-CM | POA: Diagnosis not present

## 2019-12-04 MED ORDER — DICYCLOMINE HCL 10 MG/ML IM SOLN
20.0000 mg | Freq: Once | INTRAMUSCULAR | Status: AC
  Administered 2019-12-04: 20 mg via INTRAMUSCULAR
  Filled 2019-12-04: qty 2

## 2019-12-04 MED ORDER — ONDANSETRON 8 MG PO TBDP: 8.0000 mg | ORAL_TABLET | Freq: Three times a day (TID) | ORAL | 1 refills | Status: DC | PRN

## 2019-12-04 MED ORDER — DICYCLOMINE HCL 20 MG PO TABS
20.0000 mg | ORAL_TABLET | Freq: Four times a day (QID) | ORAL | 0 refills | Status: DC | PRN
Start: 2019-12-04 — End: 2023-07-20

## 2019-12-04 MED ORDER — IOHEXOL 300 MG/ML  SOLN
100.0000 mL | Freq: Once | INTRAMUSCULAR | Status: AC | PRN
  Administered 2019-12-04: 100 mL via INTRAVENOUS

## 2019-12-04 MED ORDER — PANTOPRAZOLE SODIUM 40 MG IV SOLR
40.0000 mg | Freq: Once | INTRAVENOUS | Status: AC
  Administered 2019-12-04: 40 mg via INTRAVENOUS
  Filled 2019-12-04: qty 40

## 2019-12-04 MED ORDER — ONDANSETRON HCL 4 MG/2ML IJ SOLN
4.0000 mg | Freq: Once | INTRAMUSCULAR | Status: AC
  Administered 2019-12-04: 4 mg via INTRAVENOUS
  Filled 2019-12-04: qty 2

## 2019-12-04 NOTE — ED Notes (Signed)
Patient transported to CT 

## 2019-12-04 NOTE — ED Provider Notes (Signed)
[facility] Provider Note: Lowella DellJ. Lane Viney Acocella, MD, FACEP  CSN: [id number] MRN: [id number] ARRIVAL: 12/03/19 at 2023 ROOM: MH12/MH12   CHIEF COMPLAINT  Abdominal Pain   HISTORY OF PRESENT ILLNESS  12/04/19 12:01 AM Katherine Blair is a 54 y.o. female with a 2-day history of abdominal pain.  There is a static component that is in the epigastrium and is described as feeling like previous peptic ulcer disease.  It is also associated with heartburn despite taking Prilosec.  She is here primarily complaining of waves of pain that occur about every 30 minutes.  These began in the right upper quadrant, move left across the upper abdomen and then down into the left lower quadrant.  These spasm-like episodes last about a minute.  Nothing brings them on or makes them better.  She has had associated nausea but no vomiting or diarrhea.  She describes these episodes as feeling like labor contractions.  Her current pain is a 5 out of 10 but worsens during the contractions.   Past Medical History:  Diagnosis Date  . COPD (chronic obstructive pulmonary disease) (HCC)   . Cushing's syndrome (HCC)   . Diabetes mellitus without complication (HCC)   . GERD (gastroesophageal reflux disease)   . Hypertension   . Thyroid disease     Past Surgical History:  Procedure Laterality Date  . BLADDER SURGERY    . CESAREAN SECTION    . DILATION AND CURETTAGE OF UTERUS    . PARTIAL HYSTERECTOMY    . TUBAL LIGATION      Family History  Problem Relation Age of Onset  . Diabetes Mother   . Diabetes Father   . Diabetes Sister     Social History   Tobacco Use  . Smoking status: Current Every Day Smoker    Packs/day: 1.00    Years: 30.00    Pack years: 30.00    Types: Cigarettes  . Smokeless tobacco: Never Used  Vaping Use  . Vaping Use: Never used  Substance Use Topics  . Alcohol use: Yes    Comment: social  . Drug use: No    Prior to Admission medications   Medication Sig Start Date End Date  Taking? Authorizing Provider  Dulaglutide (TRULICITY) 3 MG/0.5ML SOPN Inject 3 mg into the skin once a week. 09/24/19  Yes Romero BellingEllison, Sean, MD  GABAPENTIN PO Take by mouth.   Yes [provider]  insulin glargine, 2 Unit Dial, (TOUJEO MAX SOLOSTAR) 300 UNIT/ML Solostar Pen Inject 50 Units into the skin every morning. 09/24/19  Yes Romero BellingEllison, Sean, MD  Insulin Pen Needle (PEN NEEDLES) 32G X 5 MM MISC 1 Device by Does not apply route daily. 09/24/19  Yes Romero BellingEllison, Sean, MD  omeprazole (PRILOSEC) 20 MG capsule Take 20 mg by mouth daily.   Yes [provider]  PHENTERMINE HCL PO Take by mouth.   Yes [provider]  atorvastatin (LIPITOR) 20 MG tablet Take 20 mg by mouth at bedtime.    [provider]  dicyclomine (BENTYL) 20 MG tablet Take 1 tablet (20 mg total) by mouth 4 (four) times daily as needed for spasms. 12/04/19   Heiley Shaikh, MD  ondansetron (ZOFRAN ODT) 8 MG disintegrating tablet Take 1 tablet (8 mg total) by mouth every 8 (eight) hours as needed for nausea or vomiting. 12/04/19   Malley Hauter, MD    Allergies Penicillins   REVIEW OF SYSTEMS  Negative except as noted here or in the History of Present Illness.  PHYSICAL EXAMINATION  Initial Vital Signs Blood pressure (!) 126/97, pulse (!) 120, temperature 98.4 F (36.9 C), temperature source Oral, resp. rate 17, height 5' 6.5" (1.689 m), weight 89.4 kg, SpO2 97 %.  Examination General: Well-developed, well-nourished female in no acute distress; appearance consistent with age of record HENT: normocephalic; atraumatic Eyes: pupils equal, round and reactive to light; extraocular muscles intact Neck: supple Heart: regular rate and rhythm Lungs: clear to auscultation bilaterally Abdomen: soft; nondistended; epigastric tenderness; bowel sounds present (while obtaining the patient's history she experienced one of her contractions; during this episode her upper abdomen, which had previously been soft to  palpation, became tense; this lasted about a minute and resolved with return to a soft abdomen) Extremities: No deformity; full range of motion; pulses normal Neurologic: Awake, alert and oriented; motor function intact in all extremities and symmetric; no facial droop Skin: Warm and dry Psychiatric: Normal mood and affect   RESULTS  Summary of this visit's results, reviewed and interpreted by myself:   EKG Interpretation  Date/Time:    Ventricular Rate:    PR Interval:    QRS Duration:   QT Interval:    QTC Calculation:   R Axis:     Text Interpretation:        Laboratory Studies: Results for orders placed or performed during the hospital encounter of 12/03/19 (from the past 24 hour(s))  Lipase, blood     Status: None   Collection Time: 12/03/19  8:49 PM  Result Value Ref Range   Lipase 37 11 - 51 U/L  Comprehensive metabolic panel     Status: Abnormal   Collection Time: 12/03/19  8:49 PM  Result Value Ref Range   Sodium 137 135 - 145 mmol/L   Potassium 3.6 3.5 - 5.1 mmol/L   Chloride 99 98 - 111 mmol/L   CO2 25 22 - 32 mmol/L   Glucose, Bld 163 (H) 70 - 99 mg/dL   BUN 12 6 - 20 mg/dL   Creatinine, Ser 7.16 0.44 - 1.00 mg/dL   Calcium 9.1 8.9 - 96.7 mg/dL   Total Protein 8.7 (H) 6.5 - 8.1 g/dL   Albumin 4.1 3.5 - 5.0 g/dL   AST 20 15 - 41 U/L   ALT 25 0 - 44 U/L   Alkaline Phosphatase 103 38 - 126 U/L   Total Bilirubin 0.2 (L) 0.3 - 1.2 mg/dL   GFR calc non Af Amer >60 >60 mL/min   GFR calc Af Amer >60 >60 mL/min   Anion gap 13 5 - 15  CBC     Status: Abnormal   Collection Time: 12/03/19  8:49 PM  Result Value Ref Range   WBC 18.7 (H) 4.0 - 10.5 K/uL   RBC 6.49 (H) 3.87 - 5.11 MIL/uL   Hemoglobin 17.1 (H) 12.0 - 15.0 g/dL   HCT 89.3 (H) 36 - 46 %   MCV 83.1 80.0 - 100.0 fL   MCH 26.3 26.0 - 34.0 pg   MCHC 31.7 30.0 - 36.0 g/dL   RDW 81.0 17.5 - 10.2 %   Platelets 335 150 - 400 K/uL   nRBC 0.0 0.0 - 0.2 %  Urinalysis, Routine w reflex microscopic Urine,  Clean Catch     Status: Abnormal   Collection Time: 12/03/19  8:49 PM  Result Value Ref Range   Color, Urine YELLOW YELLOW   APPearance CLOUDY (A) CLEAR   Specific Gravity, Urine >1.030 (H) 1.005 - 1.030   pH 6.0 5.0 -  8.0   Glucose, UA NEGATIVE NEGATIVE mg/dL   Hgb urine dipstick NEGATIVE NEGATIVE   Bilirubin Urine NEGATIVE NEGATIVE   Ketones, ur NEGATIVE NEGATIVE mg/dL   Protein, ur 093 (A) NEGATIVE mg/dL   Nitrite POSITIVE (A) NEGATIVE   Leukocytes,Ua SMALL (A) NEGATIVE  Urinalysis, Microscopic (reflex)     Status: Abnormal   Collection Time: 12/03/19  8:49 PM  Result Value Ref Range   RBC / HPF 0-5 0 - 5 RBC/hpf   WBC, UA 11-20 0 - 5 WBC/hpf   Bacteria, UA MANY (A) NONE SEEN   Squamous Epithelial / LPF 6-10 0 - 5   Imaging Studies: CT ABDOMEN PELVIS W CONTRAST  Result Date: 12/04/2019 CLINICAL DATA:  Acute abdominal pain EXAM: CT ABDOMEN AND PELVIS WITH CONTRAST TECHNIQUE: Multidetector CT imaging of the abdomen and pelvis was performed using the standard protocol following bolus administration of intravenous contrast. CONTRAST:  OMNIPAQUE IOHEXOL 300 MG/ML  SOLN COMPARISON:  CT chest 11/16/2011.  CT abdomen and pelvis 04/16/2006 FINDINGS: Lower chest: The lung bases are clear. Small esophageal hiatal hernia. Hepatobiliary: Diffuse fatty infiltration of the liver. Several small circumscribed low-attenuation lesions, likely representing cysts. The lesions are new since the previous 2007 study. Gallbladder is contracted. No bile duct dilatation. Pancreas: Unremarkable. No pancreatic ductal dilatation or surrounding inflammatory changes. Spleen: Scattered calcified splenic granulomas.  Normal spleen size. Adrenals/Urinary Tract: Small right adrenal gland nodule measuring 1.2 cm diameter. Large left adrenal gland nodule measuring 2.1 x 4.3 cm diameter. These nodules were present on the previous study without interval change. Long-term stability suggests benign etiology. The kidneys are  symmetrical. No renal mass or hydronephrosis. Bladder is unremarkable. Stomach/Bowel: Stomach, small bowel, and colon are not abnormally distended. Scattered stool throughout the colon. No wall thickening or inflammatory changes. Appendix is normal. Duodenal diverticulum. Vascular/Lymphatic: Aortic atherosclerosis. No enlarged abdominal or pelvic lymph nodes. Reproductive: Uterus is surgically absent. Both ovaries are identified and appear normal. No abnormal adnexal masses. Other: No abdominal wall hernia or abnormality. No abdominopelvic ascites. Musculoskeletal: No acute or significant osseous findings. IMPRESSION: 1. No acute process demonstrated in the abdomen or pelvis. No evidence of bowel obstruction or inflammation. 2. Diffuse fatty infiltration of the liver.  Probable hepatic cysts. 3. Small esophageal hiatal hernia. 4. Bilateral adrenal gland nodules, largest on the left measuring 4.3 cm diameter. No change since 2007 study. Long-term stability suggests benign etiology. Aortic Atherosclerosis (ICD10-I70.0). Electronically Signed   By: Burman Nieves M.D.   On: 12/04/2019 01:21    ED COURSE and MDM  Nursing notes, initial and subsequent vitals signs, including pulse oximetry, reviewed and interpreted by myself.  Vitals:   12/03/19 2028 12/03/19 2031 12/03/19 2326 12/04/19 0056  BP:  (!) 135/114 (!) 126/97 (!) 143/81  Pulse:  (!) 116 (!) 120 (!) 103  Resp:  14 17 18   Temp:  98.4 F (36.9 C)    TempSrc:  Oral    SpO2:  97% 97% 98%  Weight: 89.4 kg     Height: 5' 6.5" (1.689 m)      Medications  sodium chloride flush (NS) 0.9 % injection 3 mL (3 mLs Intravenous Not Given 12/03/19 2324)  ondansetron (ZOFRAN) injection 4 mg (4 mg Intravenous Given 12/04/19 0050)  pantoprazole (PROTONIX) injection 40 mg (40 mg Intravenous Given 12/04/19 0051)  dicyclomine (BENTYL) injection 20 mg (20 mg Intramuscular Given 12/04/19 0052)  iohexol (OMNIPAQUE) 300 MG/ML solution 100 mL (100 mLs Intravenous  Contrast Given 12/04/19 0103)  1:56 AM Feeling better after IM Bentyl and IV Zofran.  She was advised of reassuring CT scan.  We will prescribe Bentyl and Zofran for home use as her symptomatology and exam are consistent with bowel spasms.  She states she has had similar symptoms in the past.  She is followed by gastroenterology at [facility].   PROCEDURES  Procedures   ED DIAGNOSES     ICD-10-CM   1. Abdominal spasms  R10.9        Chestina Komatsu, Jonny Ruiz, MD 12/04/19 [phone]

## 2019-12-04 NOTE — Discharge Instructions (Addendum)
Follow-up with gastroenterology at [facility] if symptoms persist.

## 2019-12-06 LAB — URINE CULTURE: Culture: >=100000 — AB

## 2019-12-08 ENCOUNTER — Telehealth: Payer: Self-pay

## 2019-12-08 NOTE — Telephone Encounter (Signed)
Post ED Visit - Positive Culture Follow-up: Unsuccessful Patient Follow-up  Culture assessed and recommendations reviewed by:  []  , Pharm.D. []  Enzo Bi, Pharm.D., BCPS AQ-ID []  , Pharm.D., BCPS []  Celedonio Miyamoto, Pharm.D., BCPS []  [place], Garvin Fila.D., BCPS, AAHIVP []  , Pharm.D., BCPS, AAHIVP []  Georgina Pillion, PharmD []  , PharmD, BCPS  Positive urine culture  [x]  Patient discharged without antimicrobial prescription and treatment is now indicated []  Organism is resistant to prescribed ED discharge antimicrobial []  Patient with positive blood cultures  Needs abx Keflex 500 mg PO BID x 5 days for [facility] 12/04/19 per PA  Unable to contact patient after 3 attempts, letter will be sent to address on file  Katherine Blair 12/08/2019, 8:50 AM

## 2019-12-15 ENCOUNTER — Telehealth: Payer: Self-pay | Admitting: Emergency Medicine

## 2020-03-12 ENCOUNTER — Encounter: Payer: Self-pay | Admitting: Endocrinology

## 2020-03-15 ENCOUNTER — Other Ambulatory Visit: Payer: Self-pay | Admitting: *Deleted

## 2020-03-15 ENCOUNTER — Telehealth: Payer: Self-pay

## 2020-03-15 MED ORDER — TOUJEO MAX SOLOSTAR 300 UNIT/ML ~~LOC~~ SOPN: 50.0000 [IU] | PEN_INJECTOR | SUBCUTANEOUS | 1 refills | Status: DC

## 2020-03-15 NOTE — Telephone Encounter (Signed)
Rx sent for 30 day supply

## 2020-03-15 NOTE — Telephone Encounter (Signed)
Can you please schedule this for her

## 2020-03-15 NOTE — Telephone Encounter (Signed)
Please see below.

## 2020-03-15 NOTE — Telephone Encounter (Signed)
Patient is scheduled for a VV tomorrow 03/16/2020 at 8:30 AM and is scheduled for A1C in our lab here on Friday 03/18/20.  She is ok on Trulicity. What she needs is Toujeo to be sent to the [facility] [address], [place], [place].  PH # [phone]  If anything needs to be changed or clarified please call patient at [phone]

## 2020-03-15 NOTE — Telephone Encounter (Signed)
New message    Last seen 5.27.21   Patient receive a promotion at work traveling since July.  Out of insulin medication wondering if Dr. Everardo All would call in a high dosage due blood sugar reading .  [facility] in [place] will give more details when the CMA calls back due to driving.

## 2020-03-15 NOTE — Telephone Encounter (Signed)
I need 2 things first: 1.  Make appt 2.  Which [facility] location?

## 2020-03-15 NOTE — Telephone Encounter (Signed)
Please advise VV. I can also request A1c to be done near you.

## 2020-03-16 ENCOUNTER — Encounter: Payer: Self-pay | Admitting: Endocrinology

## 2020-03-16 ENCOUNTER — Telehealth (INDEPENDENT_AMBULATORY_CARE_PROVIDER_SITE_OTHER): Payer: PRIVATE HEALTH INSURANCE | Admitting: Endocrinology

## 2020-03-16 ENCOUNTER — Other Ambulatory Visit: Payer: Self-pay

## 2020-03-16 VITALS — Ht 66.0 in | Wt 192.0 lb

## 2020-03-16 DIAGNOSIS — E119 Type 2 diabetes mellitus without complications: Secondary | ICD-10-CM

## 2020-03-16 DIAGNOSIS — Z794 Long term (current) use of insulin: Secondary | ICD-10-CM

## 2020-03-16 MED ORDER — TRULICITY 4.5 MG/0.5ML ~~LOC~~ SOAJ: 4.5000 mg | SUBCUTANEOUS | 3 refills | Status: DC

## 2020-03-16 MED ORDER — TOUJEO MAX SOLOSTAR 300 UNIT/ML ~~LOC~~ SOPN: 110.0000 [IU] | PEN_INJECTOR | SUBCUTANEOUS | 3 refills | Status: DC

## 2020-03-16 NOTE — Progress Notes (Signed)
Subjective:    Patient ID: Katherine Blair, female    DOB: 11/25/1965, 54 y.o.   MRN: [id number]  HPI  telehealth visit today via video visit.  Alternatives to telehealth are presented to this patient, and the patient agrees to the telehealth visit. Pt is advised of the cost of the visit, and agrees to this, also.   Patient is at home, and I am at the office.   Persons attending the telehealth visit: the patient and I Pt returns for f/u of diabetes mellitus: DM type: Insulin-requiring type 2 Dx'ed: 2017 Complications: none Therapy: insulin since dx, and Trulicity.   GDM: never DKA: never Severe hypoglycemia: never Pancreatitis: never Pancreatic imaging: normal on 2017 CT.  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   Other: she takes qd insulin, at least for now.  Interval history:  She takes meds as rx'ed.  pt states she feels well in general.  She increased Toujeo to 100 units qd x 2 mos, due to increased cbg's.  She says cbg varies from 122-200's, since the increase.   Past Medical History:  Diagnosis Date  . COPD (chronic obstructive pulmonary disease) (HCC)   . Cushing's syndrome (HCC)   . Diabetes mellitus without complication (HCC)   . GERD (gastroesophageal reflux disease)   . Hypertension   . Thyroid disease     Past Surgical History:  Procedure Laterality Date  . BLADDER SURGERY    . CESAREAN SECTION    . DILATION AND CURETTAGE OF UTERUS    . PARTIAL HYSTERECTOMY    . TUBAL LIGATION      Social History   Socioeconomic History  . Marital status: Single    Spouse name: Not on file  . Number of children: Not on file  . Years of education: Not on file  . Highest education level: Not on file  Occupational History  . Not on file  Tobacco Use  . Smoking status: Current Every Day Smoker    Packs/day: 1.00    Years: 30.00    Pack years: 30.00    Types: Cigarettes  . Smokeless tobacco: Never Used  Vaping Use  . Vaping Use: Never used  Substance and Sexual  Activity  . Alcohol use: Yes    Comment: social  . Drug use: No  . Sexual activity: Not on file  Other Topics Concern  . Not on file  Social History Narrative  . Not on file   Social Determinants of Health   Financial Resource Strain:   . Difficulty of Paying Living Expenses: Not on file  Food Insecurity:   . Worried About Programme researcher, broadcasting/film/video in the Last Year: Not on file  . Ran Out of Food in the Last Year: Not on file  Transportation Needs:   . Lack of Transportation (Medical): Not on file  . Lack of Transportation (Non-Medical): Not on file  Physical Activity:   . Days of Exercise per Week: Not on file  . Minutes of Exercise per Session: Not on file  Stress:   . Feeling of Stress : Not on file  Social Connections:   . Frequency of Communication with Friends and Family: Not on file  . Frequency of Social Gatherings with Friends and Family: Not on file  . Attends Religious Services: Not on file  . Active Member of Clubs or Organizations: Not on file  . Attends Banker Meetings: Not on file  . Marital Status: Not on file  Intimate  Partner Violence:   . Fear of Current or Ex-Partner: Not on file  . Emotionally Abused: Not on file  . Physically Abused: Not on file  . Sexually Abused: Not on file    Current Outpatient Medications on File Prior to Visit  Medication Sig Dispense Refill  . atorvastatin (LIPITOR) 20 MG tablet Take 20 mg by mouth at bedtime.    . dicyclomine (BENTYL) 20 MG tablet Take 1 tablet (20 mg total) by mouth 4 (four) times daily as needed for spasms. 20 tablet 0  . GABAPENTIN PO Take by mouth.    . Insulin Pen Needle (PEN NEEDLES) 32G X 5 MM MISC 1 Device by Does not apply route daily. 90 each 3  . omeprazole (PRILOSEC) 20 MG capsule Take 20 mg by mouth daily.    . ondansetron (ZOFRAN ODT) 8 MG disintegrating tablet Take 1 tablet (8 mg total) by mouth every 8 (eight) hours as needed for nausea or vomiting. 10 tablet 1  . PHENTERMINE HCL PO  Take by mouth.     No current facility-administered medications on file prior to visit.    Allergies  Allergen Reactions  . Penicillins Rash    Family History  Problem Relation Age of Onset  . Diabetes Mother   . Diabetes Father   . Diabetes Sister     Ht 5\' 6"  (1.676 m)   Wt 192 lb (87.1 kg)   BMI 30.99 kg/m    Review of Systems     Objective:   Physical Exam        Assessment & Plan:  Insulin-requiring type 2 DM: uncontrolled.   Patient Instructions  I have sent a prescription to your pharmacy, to increase the Toujeo to 100 units each morning, and:  Please come in to have the A1c checked. check your blood sugar twice a day.  vary the time of day when you check, between before the 3 meals, and at bedtime.  also check if you have symptoms of your blood sugar being too high or too low.  please keep a record of the readings and bring it to your next appointment here (or you can bring the meter itself).  You can write it on any piece of paper.  please call sooner if your blood sugar goes below 70, or if you have a lot of readings over 200. Please reduce the Toujeo to 50 units each morning.   I have sent a prescription to your pharmacy, to increase the Trulicity again.  On this type of insulin schedule, you should eat meals on a regular schedule.  If a meal is missed or significantly delayed, your blood sugar could go low.   Please have a follow-up appointment in 2 months.

## 2020-03-16 NOTE — Patient Instructions (Addendum)
I have sent a prescription to your pharmacy, to increase the Toujeo to 100 units each morning, and:  Please come in to have the A1c checked. check your blood sugar twice a day.  vary the time of day when you check, between before the 3 meals, and at bedtime.  also check if you have symptoms of your blood sugar being too high or too low.  please keep a record of the readings and bring it to your next appointment here (or you can bring the meter itself).  You can write it on any piece of paper.  please call us sooner if your blood sugar goes below 70, or if you have a lot of readings over 200. Please reduce the Toujeo to 50 units each morning.   I have sent a prescription to your pharmacy, to increase the Trulicity again.  On this type of insulin schedule, you should eat meals on a regular schedule.  If a meal is missed or significantly delayed, your blood sugar could go low.   Please have a follow-up appointment in 2 months.

## 2020-03-18 ENCOUNTER — Other Ambulatory Visit: Payer: PRIVATE HEALTH INSURANCE

## 2021-02-12 ENCOUNTER — Inpatient Hospital Stay
Admit: 2021-02-12 | Discharge: 2021-02-12 | Disposition: A | Discharge status: Home / Self Care | Payer: PRIVATE HEALTH INSURANCE | Attending: Surgery

## 2021-02-12 ENCOUNTER — Other Ambulatory Visit

## 2021-02-12 ENCOUNTER — Encounter

## 2021-02-12 ENCOUNTER — Inpatient Hospital Stay: Admit: 2021-02-12 | Payer: PRIVATE HEALTH INSURANCE

## 2021-02-12 ENCOUNTER — Encounter (HOSPITAL_BASED_OUTPATIENT_CLINIC_OR_DEPARTMENT_OTHER)

## 2021-02-12 ENCOUNTER — Emergency Department: Admit: 2021-02-12 | Payer: PRIVATE HEALTH INSURANCE

## 2021-02-12 DIAGNOSIS — M5442 Lumbago with sciatica, left side: Secondary | ICD-10-CM

## 2021-02-12 LAB — URINALYSIS REFLEX TO CULTURE
Bilirubin, Ur: NEGATIVE
Blood, Ur: NEGATIVE
Glucose,Ur: >=500 mg/dL — AB
Ketones, Ur: 5 mg/dL — AB
Leukocyte Esterase, Ur: NEGATIVE WBC/uL
Nitrite, Ur: NEGATIVE
Protein,Ur: NEGATIVE mg/dL
RBC, Ur: 2 /HPF (ref 0–2)
Specific Gravity, Ur: >1.03 — ABNORMAL HIGH (ref 1.003–1.030)
Squamous Epithelial Cells, Ur: 2 /HPF (ref 0–5)
Urobilinogen, Ur: NEGATIVE
WBC, Ur: 5 /HPF (ref 0–5)
pH, Ur: 5 (ref 5.0–8.0)

## 2021-02-12 MED ORDER — HYDROcodone-acetaminophen (Norco) 5-325 mg tablet
5-325 | ORAL_TABLET | Freq: Four times a day (QID) | ORAL | 0 refills | Status: AC | PRN
Start: 2021-02-12 — End: 2021-02-15

## 2021-02-12 NOTE — ED Progress Note (Signed)
 Addendum:  Ct scan of the patient's hip shows no fractures, but findings consistent with osteoarthritis.  Patient will be discharged at this time for follow up with ortho or her PCP     Olivia Canter, MD  02/12/21 [phone]

## 2021-02-12 NOTE — Discharge Instructions (Addendum)
 No driving, alcohol use, or using other sedating medication if taking the pain prescription.  Call [facility] tomorrow to arrange follow up and further treatment.

## 2021-02-12 NOTE — ED Notes (Signed)
 Patient to CT scan.     Audelia Hives, RN  02/12/21 [phone]

## 2021-02-12 NOTE — ED Notes (Signed)
 Pt d/c, VSS, went over d/c instructions with pt.      Otis Peak, RN  02/12/21 1925

## 2021-02-12 NOTE — ED Triage Notes (Signed)
 Pt presents to the ER complaining of right hip pain and lower back pain, pt states she had it for while, states 6 months worse the last week, can't sleep. Pt states taking tylenol and ibuprofen and not much improvement. Pt states she does travel a lot for work.

## 2021-02-12 NOTE — Other (Signed)
 Patient Education   Table of Contents       Sciatica     To view videos and all your education online visit,   [email or web address]   or scan this QR code with your smartphone.                    Sciatica        Sciatica is pain, weakness, tingling, or loss of feeling (numbness) along the sciatic nerve. The sciatic nerve starts in the lower back and goes down the back of each leg. Sciatica usually goes away on its own or with treatment. Sometimes, sciatica may come back (recur).     What are the causes?    This condition happens when the sciatic nerve is pinched or has pressure put on it. This may be the result of:       A disk in between the bones of the spine bulging out too far (herniated disk).       Changes in the spinal disks that occur with aging.       A condition that affects a muscle in the butt.       Extra bone growth near the sciatic nerve.       A break (fracture) of the area between your hip bones (pelvis).       Pregnancy.       Tumor. This is rare.     What increases the risk?    You are more likely to develop this condition if you:       Play sports that put pressure or stress on the spine.       Have poor strength and ease of movement (flexibility).       Have had a back injury in the past.       Have had back surgery.       Sit for long periods of time.       Do activities that involve bending or lifting over and over again.       Are very overweight (obese).     What are the signs or symptoms?    Symptoms can vary from mild to very bad. They may include:      Any of these problems in the lower back, leg, hip, or butt:       Mild tingling, loss of feeling, or dull aches.       Burning sensations.       Sharp pains.       Loss of feeling in the back of the calf or the sole of the foot.       Leg weakness.       Very bad back pain that makes it hard to move.     These symptoms may get worse when you cough, sneeze, or laugh. They may also get worse when you sit or stand for long periods  of time.   How is this treated?    This condition often gets better without any treatment. However, treatment may include:       Changing or cutting back on physical activity when you have pain.       Doing exercises and stretching.       Putting ice or heat on the affected area.      Medicines that help:       To relieve pain and swelling.       To relax your muscles.  Shots (injections) of medicines that help to relieve pain, irritation, and swelling.       Surgery.     Follow these instructions at home:   Medicines         Take over-the-counter and prescription medicines only as told by your doctor.      Ask your doctor if the medicine prescribed to you:       Requires you to avoid driving or using heavy machinery.      Can cause trouble pooping (constipation). You may need to take these steps to prevent or treat trouble pooping:       Drink enough fluids to keep your pee (urine) pale yellow.       Take over-the-counter or prescription medicines.       Eat foods that are high in fiber. These include beans, whole grains, and fresh fruits and vegetables.       Limit foods that are high in fat and sugar. These include fried or sweet foods.         Managing pain              If told, put ice on the affected area.       Put ice in a plastic bag.       Place a towel between your skin and the bag.       Leave the ice on for 20 minutes, 2?3 times a day.      If told, put heat on the affected area. Use the heat source that your doctor tells you to use, such as a moist heat pack or a heating pad.       Place a towel between your skin and the heat source.       Leave the heat on for 20?30 minutes.       Remove the heat if your skin turns bright red. This is very important if you are unable to feel pain, heat, or cold. You may have a greater risk of getting burned.       Activity            Return to your normal activities as told by your doctor. Ask your doctor what activities are safe for you.       Avoid activities  that make your symptoms worse.      Take short rests during the day.       When you rest for a long time, do some physical activity or stretching between periods of rest.       Avoid sitting for a long time without moving. Get up and move around at least one time each hour.       Exercise and stretch regularly, as told by your doctor.      Do not  lift anything that is heavier than 10 lb (4.5 kg) while you have symptoms of sciatica.       Avoid lifting heavy things even when you do not have symptoms.       Avoid lifting heavy things over and over.      When you lift objects, always lift in a way that is safe for your body. To do this, you should:       Bend your knees.       Keep the object close to your body.       Avoid twisting.       General instructions         Stay  at a healthy weight.       Wear comfortable shoes that support your feet. Avoid wearing high heels.       Avoid sleeping on a mattress that is too soft or too hard. You might have less pain if you sleep on a mattress that is firm enough to support your back.       Keep all follow-up visits as told by your doctor. This is important.       Contact a doctor if:        You have pain that:       Wakes you up when you are sleeping.       Gets worse when you lie down.       Is worse than the pain you have had in the past.       Lasts longer than 4 weeks.       You lose weight without trying.     Get help right away if:         You cannot control when you pee (urinate) or poop (have a bowel movement).      You have weakness in any of these areas and it gets worse:       Lower back.       The area between your hip bones.       Butt.       Legs.       You have redness or swelling of your back.       You have a burning feeling when you pee.     Summary         Sciatica is pain, weakness, tingling, or loss of feeling (numbness) along the sciatic nerve.       This condition happens when the sciatic nerve is pinched or has pressure put on it.       Sciatica can  cause pain, tingling, or loss of feeling (numbness) in the lower back, legs, hips, and butt.       Treatment often includes rest, exercise, medicines, and putting ice or heat on the affected area.     This information is not intended to replace advice given to you by your health care provider. Make sure you discuss any questions you have with your health care provider.     Document Released: 09/26/2009Document Revised: 01/06/2020Document Reviewed: 05/05/2018     Elsevier Patient Education ? 2022 [employer]

## 2021-02-12 NOTE — ED Provider Notes (Signed)
 HPI   Chief Complaint   Patient presents with   . Back Pain   . Hip Pain       Patient presents for complaint of left back buttock and hip pain.  Has been going on for over 6 months but has worsened in the past week or so.  Pain worsens by walking and sitting.  Pain radiates down to the left thigh.  It does not extend into the toes.  There is no numbness or tingling.  There is no true incontinence or retention.  There is no weakness.  She has been using Tylenol and Motrin for the pain without relief.  She denies any fevers or chills.  Denies history of IV drug abuse or cancer history.  She denies abdominal pain or unexplained weight loss.  She is currently staying at a local hotel as [place] and is here for work.                    No data recorded                                Patient History   Past Medical History:   Diagnosis Date   . Diabetes mellitus (CMS/HCC)      History reviewed. No pertinent surgical history.  No family history on file.  Social History     Tobacco Use   . Smoking status: Never   . Smokeless tobacco: Never   Substance Use Topics   . Alcohol use: Not on file   . Drug use: Not on file       Review of Systems   Review of Systems   Constitutional: Negative for chills and fever.   HENT: Negative for ear pain and sore throat.    Eyes: Negative for pain and visual disturbance.   Respiratory: Negative for cough and shortness of breath.    Cardiovascular: Negative for chest pain and palpitations.   Gastrointestinal: Negative for abdominal pain and vomiting.   Genitourinary: Negative for dysuria and hematuria.   Musculoskeletal: Positive for arthralgias and back pain.   Skin: Negative for color change and rash.   Neurological: Negative for seizures and syncope.   All other systems reviewed and are negative.      Physical Exam   ED Triage Vitals [02/12/21 1418]   Temp Pulse Resp BP   36.4 C (97.6 F) 106 17 137/88      SpO2 Temp Source Heart Rate Source Patient Position   99 % Temporal  -- --      BP Location FiO2 (%)     -- --       Physical Exam  Vitals and nursing note reviewed.   Constitutional:       Appearance: Normal appearance.   Musculoskeletal:         General: Tenderness (Tenderness over the left sciatic notch and left hip, no deformities or swelling.) present. No deformity. Normal range of motion.   Skin:     General: Skin is warm and dry.      Capillary Refill: Capillary refill takes less than 2 seconds.   Neurological:      Mental Status: She is alert.      Sensory: No sensory deficit.      Motor: No weakness.       XR PELVIS 1-2 VIEWS   Final Result   No acute fractures  or dislocations.      Rosita Fire, MD 02/12/2021 6:04 PM      XR HIP LEFT 2 OR 3 VIEWS   Final Result   No definite fracture or dislocation of the left hip.      Linear lucency through the greater trochanter appears to be due to skin folds. If there is focal tenderness a CT scan should be obtained.      Rosita Fire, MD 02/12/2021 6:07 PM      XR LUMBAR SPINE 2-3 VIEWS   Final Result   No significant degenerative disease.        Rosita Fire, MD 02/12/2021 6:08 PM          Labs Reviewed   URINALYSIS REFLEX TO CULTURE - Abnormal       Result Value    Color, Ur Light Yellow      Clarity, Ur Hazy (*)     Specific Gravity, Ur >1.030 (*)     pH, Ur 5.0      Protein,Ur Negative      Glucose,Ur >=500 (*)     Ketones, Ur 5 (*)     Bilirubin, Ur Negative      Blood, Ur Negative      Urobilinogen, Ur Negative      Nitrite, Ur Negative      Leukocyte Esterase, Ur Negative      RBC, Ur 2      WBC, Ur 5      Squamous Epithelial Cells, Ur 2      Bacteria, Ur Trace (*)        ED Course & MDM   Diagnoses as of 02/12/21 1852   Chronic left-sided low back pain with left-sided sciatica   Trochanteric bursitis of left hip       MDM  Number of Diagnoses or Management Options  Chronic left-sided low back pain with left-sided sciatica  Trochanteric bursitis of left hip  Diagnosis management comments: X-ray of the lumbar spine and hip  pelvis were obtained interpreted by the radiologist.  No fracture or dislocation.  Moderate sacroiliac joint degeneration.  Mild disc height loss is present.  Moderate facet arthropathy at L4/5 and L5/S1.  There is no compression fracture.  There is a linear lucency through the greater trochanter which may be artifact due to skin folds, if there is tenderness a CT scan should be obtained.  Patient does have tenderness over this area therefore a CT hip was ordered.  The results of the CT scan are pending.  If negative I will anticipate patient being discharged home.  I will provide her a prescription for Percocet.  PMP was reviewed.  I did give her contact information for the local orthopedics center, if she is staying for a while for work she can arrange follow-up with them for further evaluation and treatment.       Amount and/or Complexity of Data Reviewed  Clinical lab tests: reviewed  Tests in the radiology section of CPT: reviewed    Patient Progress  Patient progress: stable         Berneta Levins, MD  02/12/21 1856

## 2021-02-12 NOTE — ED Progress Note (Signed)
 [facility]    MEDICAL SCREENING EXAM - PROVIDER IN TRIAGE    Brief HPI: Patient is a 55 year old female who comes in with report of bilateral low back pain radiating to left leg over the last 6 months worsening over the last week.  Patient denies any fall or trauma to the area.  Denies any numbness or saddle anesthesia.  Patient states she has had several episodes of urinary incontinence although she notes she knows that she has to urinate and just cannot make it to the bathroom in time.  She denies any changes in bowel habits.  Is any abdominal pain, nausea, or vomiting.  Denies any recent fever.    Brief Physical Exam:  Constitutional: Vital signs reviewed. Well-nourished.  Respiratory: Nonlabored. Speaking in full clear sentences.   Skin: Normal color. No rash.  Neuro: Alert. Normal gross motor.  Psych: Normal affect.    Tests ordered: Urinalysis    Placement: Main ER    Patient was advised not to leave the emergency department until further evaluation and work-up is complete.     Harrell Gave, [place]  02/12/21 1424

## 2021-02-12 NOTE — ED Notes (Signed)
 Patient returned from CT scan, awaiting results. Discharge information completed pending negative CT from MD.      Audelia Hives, RN  02/12/21 1913

## 2021-07-05 IMAGING — CT CT ABD-PELV W/ CM
2 of 5 series · 16 of 46 positions shown, 18 images · IV contrast (Omnipaque)
Comparison: CT chest 11/16/2011.  CT abdomen and pelvis 04/16/2006

CLINICAL DATA: Acute abdominal pain

EXAM:
CT ABDOMEN AND PELVIS WITH CONTRAST
TECHNIQUE: Multidetector CT imaging of the abdomen and pelvis was performed
using the standard protocol following bolus administration of
intravenous contrast.
CONTRAST:  100mL OMNIPAQUE IOHEXOL 300 MG/ML  SOLN

[Series 2: axial st · axial · 0.87mm/px · z∈[+575,+985]mm · 13 of 92 slices shown, 15 images]
[im 5/92  soft-tissue]
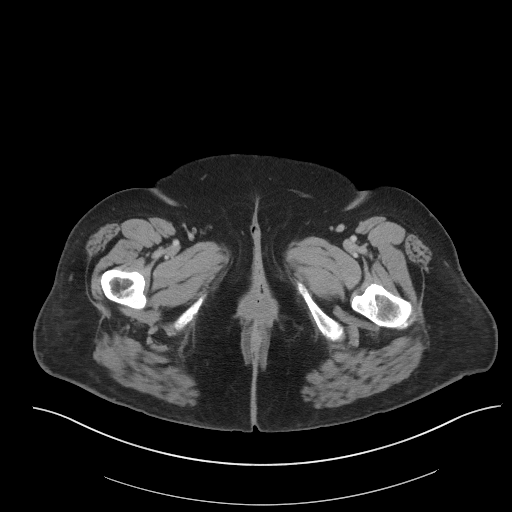
[im 5/92  bone]
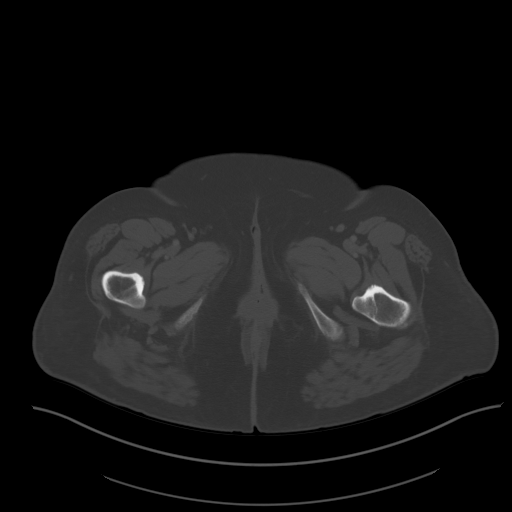
[im 14/92  soft-tissue]
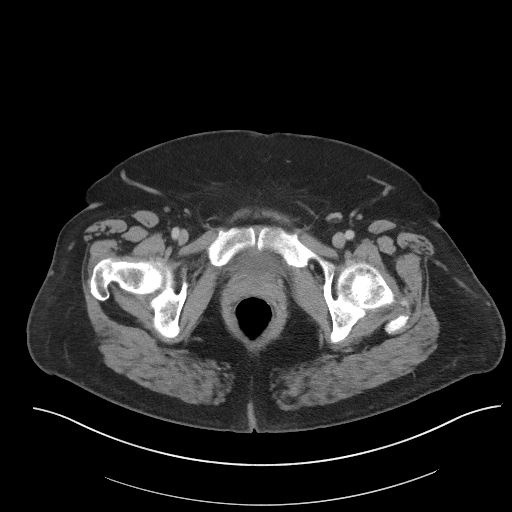
[im 19/92  soft-tissue]
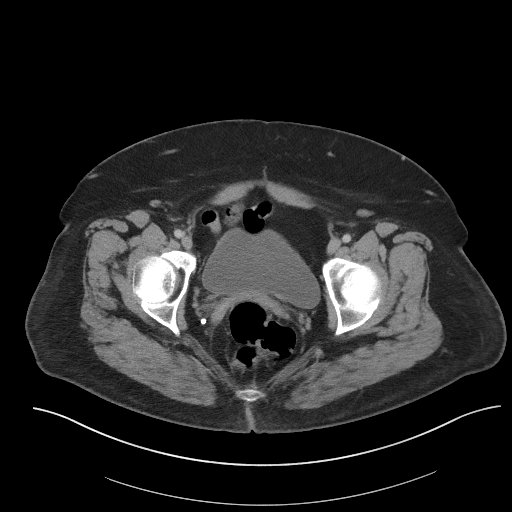
[im 28/92  soft-tissue]
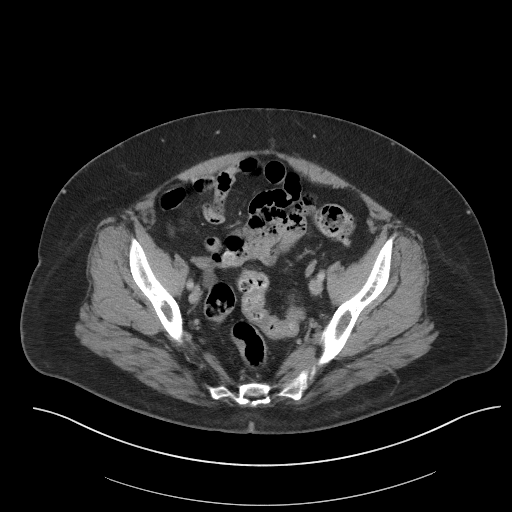
[im 32/92  soft-tissue]
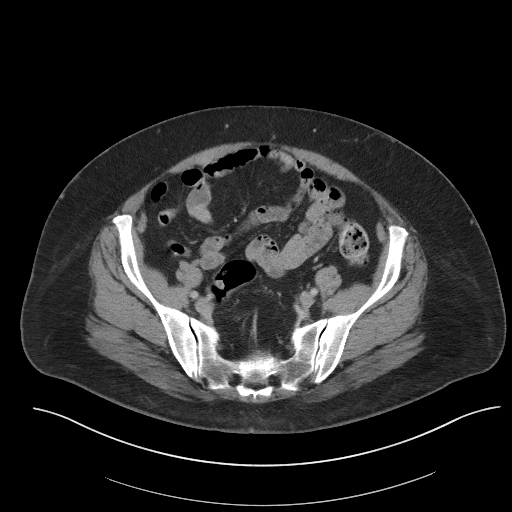
[im 41/92  soft-tissue]
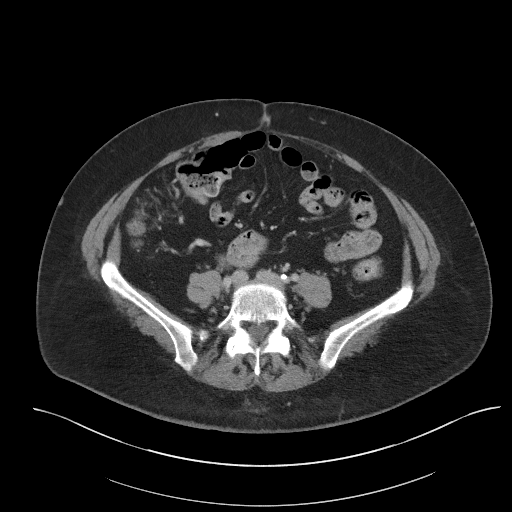
[im 46/92  soft-tissue]
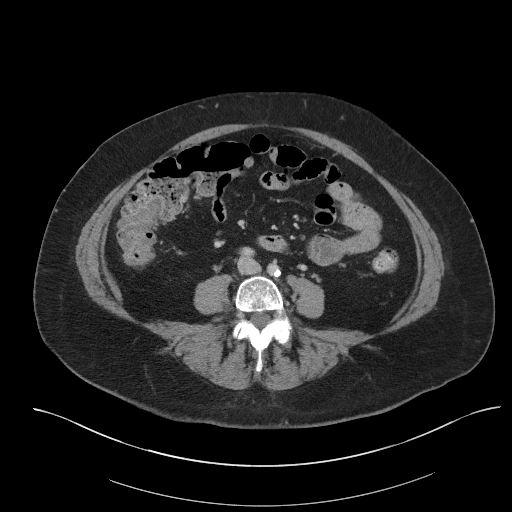
[im 51/92  soft-tissue]
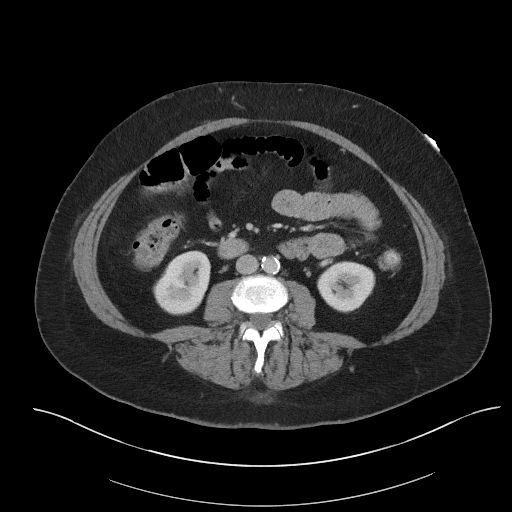
[im 60/92  soft-tissue]
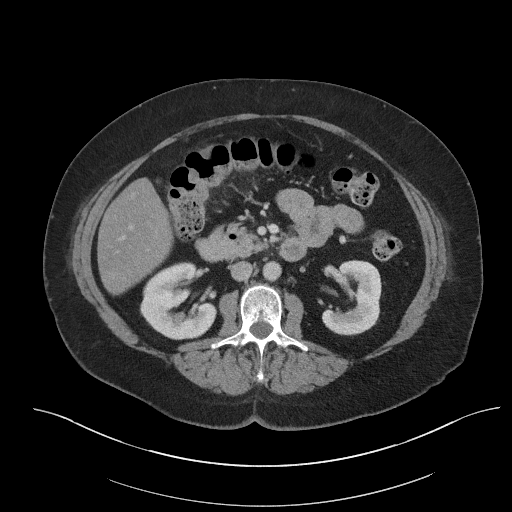
[im 60/92  bone]
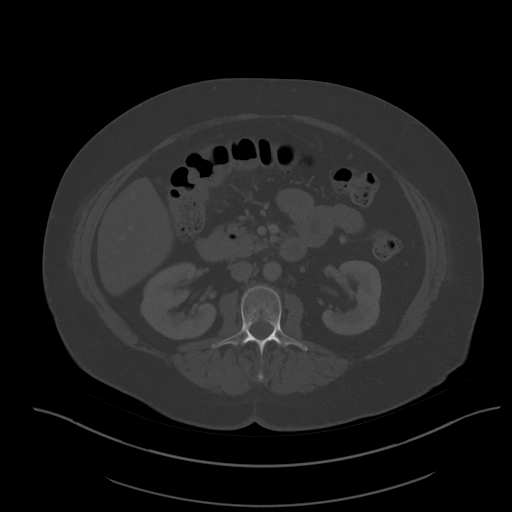
[im 64/92  soft-tissue]
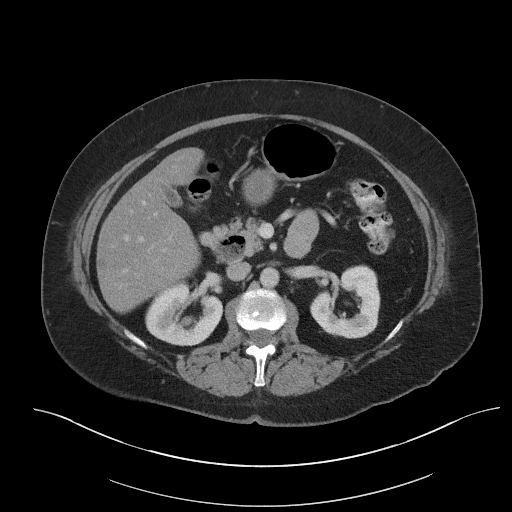
[im 73/92  soft-tissue]
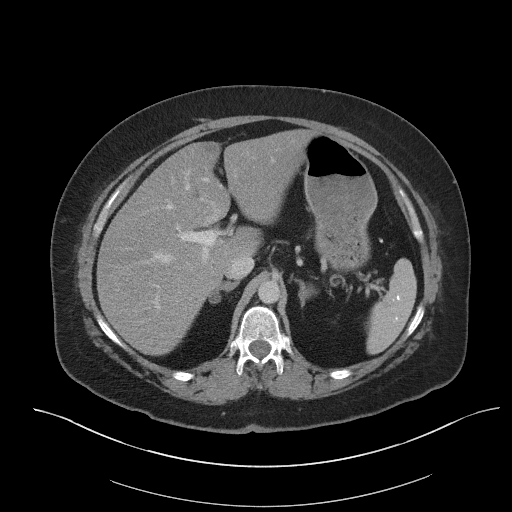
[im 78/92  soft-tissue]
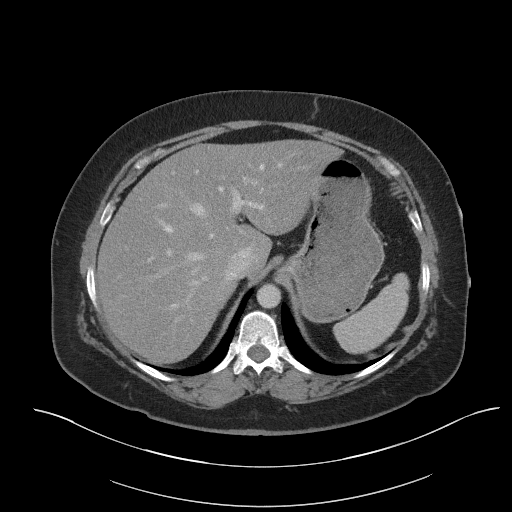
[im 87/92  soft-tissue]
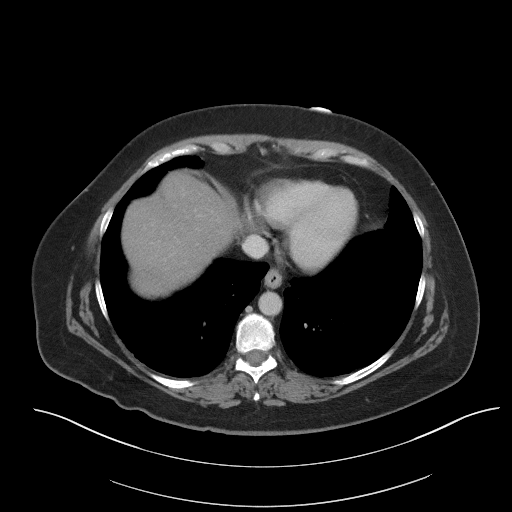

[Series 5: coronal st · coronal · 0.76mm/px · 3 of 101 slices shown]
[im 34/101  soft-tissue]
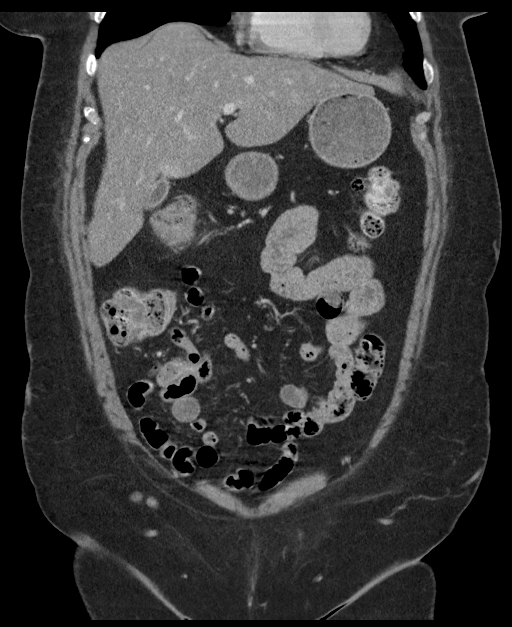
[im 45/101  soft-tissue]
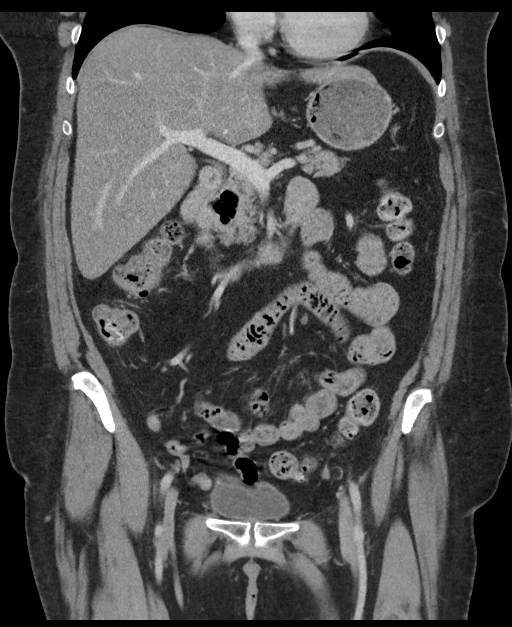
[im 56/101  soft-tissue]
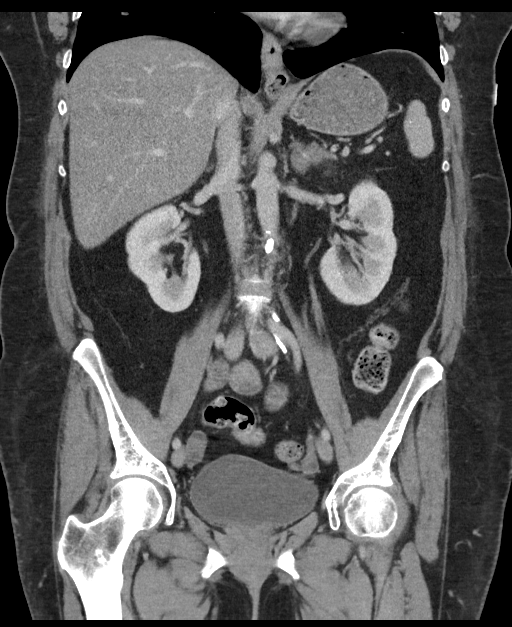

[16 of 46 positions shown; findings below may reference images not displayed]

FINDINGS: Lower chest: The lung bases are clear. Small esophageal hiatal
hernia.

Hepatobiliary: Diffuse fatty infiltration of the liver. Several
small circumscribed low-attenuation lesions, likely representing
cysts. The lesions are new since the previous 3222 study.
Gallbladder is contracted. No bile duct dilatation.

Pancreas: Unremarkable. No pancreatic ductal dilatation or
surrounding inflammatory changes.

Spleen: Scattered calcified splenic granulomas.  Normal spleen size.

Adrenals/Urinary Tract: Small right adrenal gland nodule measuring
1.2 cm diameter. Large left adrenal gland nodule measuring 2.1 x
cm diameter. These nodules were present on the previous study
without interval change. Long-term stability suggests benign
etiology. The kidneys are symmetrical. No renal mass or
hydronephrosis. Bladder is unremarkable.

Stomach/Bowel: Stomach, small bowel, and colon are not abnormally
distended. Scattered stool throughout the colon. No wall thickening
or inflammatory changes. Appendix is normal. Duodenal diverticulum.

Vascular/Lymphatic: Aortic atherosclerosis. No enlarged abdominal or
pelvic lymph nodes.

Reproductive: Uterus is surgically absent. Both ovaries are
identified and appear normal. No abnormal adnexal masses.

Other: No abdominal wall hernia or abnormality. No abdominopelvic
ascites.

Musculoskeletal: No acute or significant osseous findings.
IMPRESSION: 1. No acute process demonstrated in the abdomen or pelvis. No
evidence of bowel obstruction or inflammation.
2. Diffuse fatty infiltration of the liver.  Probable hepatic cysts.
3. Small esophageal hiatal hernia.
4. Bilateral adrenal gland nodules, largest on the left measuring
4.3 cm diameter. No change since 3222 study. Long-term stability
suggests benign etiology.

Aortic Atherosclerosis (A0CT2-H7V.V).

## 2021-07-06 ENCOUNTER — Other Ambulatory Visit: Payer: Self-pay | Admitting: Endocrinology

## 2021-07-06 DIAGNOSIS — Z1231 Encounter for screening mammogram for malignant neoplasm of breast: Secondary | ICD-10-CM

## 2021-08-21 ENCOUNTER — Ambulatory Visit: Payer: PRIVATE HEALTH INSURANCE

## 2021-12-13 ENCOUNTER — Ambulatory Visit (HOSPITAL_COMMUNITY)
Admission: EM | Admit: 2021-12-13 | Discharge: 2021-12-13 | Disposition: A | Discharge status: Home / Self Care | Payer: PRIVATE HEALTH INSURANCE | Attending: Family | Admitting: Family

## 2021-12-13 ENCOUNTER — Telehealth (HOSPITAL_COMMUNITY): Payer: Self-pay | Admitting: Psychiatry

## 2021-12-13 DIAGNOSIS — F332 Major depressive disorder, recurrent severe without psychotic features: Secondary | ICD-10-CM

## 2021-12-13 MED ORDER — CITALOPRAM HYDROBROMIDE 20 MG PO TABS: 20.0000 mg | ORAL_TABLET | Freq: Every day | ORAL | 0 refills | Status: DC

## 2021-12-13 NOTE — ED Triage Notes (Signed)
Pt presents to [facility] voluntarily due to worsening depression symptoms. Pt states she has been overwhelmed recently after finding out that she has cirrhosis of the liver. Pt reports stress from her job as well. Pt states that she has been supressing her feelings to remain strong for her family. Pt reports increased anxiety. Pt states she was prescribed medication by her pcp for her depression symptoms but they are no-longer in practice and cannot refill the prescription. Pt states she would benefit from a medication refill, therapy and some time off from work.Pt denies SI/HI and AVH.

## 2021-12-13 NOTE — ED Notes (Signed)
Patient discharged by provider with written and verbal instructions. 

## 2021-12-13 NOTE — BH Assessment (Signed)
LCSW Progress Note   Per Doran Heater, NP, this pt does not require psychiatric hospitalization at this time.  Pt is psychiatrically cleared.  Referral was made to [facility] for IOP.  EDP Doran Heater, NP, has been notified.  Hansel Starling, MSW, LCSW [facility] [phone] or [phone]

## 2021-12-13 NOTE — ED Provider Notes (Cosign Needed Addendum)
Behavioral Health Urgent Care Medical Screening Exam  Patient Name: Katherine Blair MRN: [id number] Date of Evaluation: 12/13/21 Chief Complaint:   Diagnosis:  Final diagnoses:  MDD (major depressive disorder), recurrent severe, without psychosis (HCC)    History of Present illness: Katherine Blair is a 56 y.o. female. Patient presents voluntarily to [facility] behavioral health for walk-in assessment.   Patient is assessed, face-to-face, by nurse practitioner, seated in assessment area, no acute distress.  She  is alert and oriented, pleasant and cooperative during assessment.   Katherine Blair would like to follow-up with psychiatry as she feels like she "had a meltdown today because of the combination of things."  Recent stressors include ongoing health concerns for approximately 6 months.  She was notified she had been diagnosed with cirrhosis of the liver 1 month ago, notified today that her "Barrett's esophagus is back."  She has minimized her concerns about her overall physical health to her family in an effort to decrease their worry.  She would like to have time off work "to process."  She is concerned that if she asked for an extended time off she will not be approved.  She states "I cannot afford to leave a job because I need my insurance."  Patient endorses symptoms of depression ongoing for approximately 6 months.  She reports low energy, fatigue, depressed mood, decreased sleep.  Patient  presents with depressed mood, tearful affect. She  denies suicidal and homicidal ideations. She endorses history of 1 previous suicide attempt, approximately 30 years ago when she cut her wrist.  She endorses history of 1 previous inpatient psychiatric hospitalization, also 30 years ago. Patient easily  contracts verbally for safety with this Clinical research associate.  Katherine Blair has been diagnosed with major depressive disorder.  She is not linked with outpatient psychiatry currently.  She was seen briefly by outpatient medication  management provider, provider recently left practice.  Seeking outpatient provider. She has been compliant with citalopram 20mg  daily as prescribed , ran out of this medication several days ago. She is not linked with outpatient counseling currently.  Family mental health history includes patient's father who was diagnosed with bipolar disorder.  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  She denies alcohol and substance use currently.  She has rarely used Adderall, which she is not prescribed, in an attempt to get more work done.  Most recent Adderall approximately 3 weeks ago.    Patient has normal speech and behavior.  She  denies auditory and visual hallucinations.  Patient is able to converse coherently with goal-directed thoughts and no distractibility or preoccupation.  Denies symptoms of paranoia.  Objectively there is no evidence of psychosis/mania or delusional thinking.  Katherine Blair resides in [place] with her boyfriend.  She denies access to weapons.  She travels during the week for work, home on weekends.  Patient endorses average appetite.  Patient offered support and encouragement.  She gives verbal consent to speak with boyfriend, [place] phone number [phone].  Spoke with patient's boyfriend who denies safety concerns, denies access to weapons.  He agrees with plan for follow-up with outpatient psychiatry and verbalizes plan to return to the nearest emergency department if mental health condition deteriorates.   Patient and family are educated and verbalize understanding of mental health resources and other crisis services in the community. They are instructed to call 911 and present to the  nearest emergency room should patient experience any suicidal/homicidal ideation, auditory/visual/hallucinations, or detrimental worsening of mental health condition.    Psychiatric Specialty  Exam  Presentation  General Appearance:Appropriate for Environment; Casual  Eye Contact:Good  Speech:Clear and Coherent; Normal Rate  Speech Volume:Normal  Handedness:Right   Mood and Affect  Mood:Depressed  Affect:Depressed; Tearful   Thought Process  Thought Processes:Coherent; Goal Directed; Linear  Descriptions of Associations:Intact  Orientation:Full (Time, Place and Person)  Thought Content:Logical; WDL    Hallucinations:None  Ideas of Reference:None  Suicidal Thoughts:No  Homicidal Thoughts:No   Sensorium  Memory:Immediate Good; Recent Good  Judgment:Good  Insight:Good   Executive Functions  Concentration:Good  Attention Span:Good  Recall:Good  Fund of Knowledge:Good  Language:Good   Psychomotor Activity  Psychomotor Activity:Normal   Assets  Assets:Communication Skills; Desire for Improvement; Financial Resources/Insurance; Intimacy; Housing; Leisure Time; Social Support; Resilience; Talents/Skills; Transportation; Vocational/Educational   Sleep  Sleep:Fair  Number of hours: No data recorded  No data recorded  Physical Exam: Physical Exam Vitals and nursing note reviewed.  Constitutional:      Appearance: Normal appearance. She is well-developed.  HENT:     Head: Normocephalic and atraumatic.     Nose: Nose normal.  Cardiovascular:     Rate and Rhythm: Normal rate.  Pulmonary:     Effort: Pulmonary effort is normal.  Musculoskeletal:        General: Normal range of motion.     Cervical back: Normal range of motion.  Skin:    General: Skin is warm and dry.  Neurological:     Mental Status: She is alert and oriented to person, place, and time.  Psychiatric:        Attention and Perception: Attention and perception normal.        Mood and Affect: Mood is depressed. Affect is tearful.        Speech: Speech normal.        Behavior: Behavior normal. Behavior is cooperative.        Thought Content: Thought content  normal.        Cognition and Memory: Cognition and memory normal.    Review of Systems  Constitutional: Negative.   HENT: Negative.    Eyes: Negative.   Respiratory: Negative.    Cardiovascular: Negative.   Gastrointestinal: Negative.   Genitourinary: Negative.   Musculoskeletal: Negative.   Skin: Negative.   Neurological: Negative.   Psychiatric/Behavioral:  Positive for depression.    Blood pressure 138/86, pulse 100, temperature 98.2 F (36.8 C), temperature source Oral, resp. rate 18, SpO2 100 %. There is no height or weight on file to calculate BMI.  Musculoskeletal: Strength & Muscle Tone: within normal limits Gait & Station: normal Patient leans: N/A   [facility] MSE Discharge Disposition for Follow up and Recommendations: Based on my evaluation the patient does not appear to have an emergency medical condition and can be discharged with resources and follow up care in outpatient services for Medication Management, Partial Hospitalization Program, and Individual Therapy Patient reviewed with Dr. Nelly Rout. We will with outpatient psychiatry, resources provided. Medication: -citalopram 20mg  daily/mood  You have been referred to intensive outpatient versus partial hospitalization program at [facility] health.  Program coordinator will reach out to you to discuss further intensive outpatient and partial hospitalization programs.  [facility], FNP 12/13/2021, 1:57 PM

## 2021-12-13 NOTE — Discharge Instructions (Addendum)

## 2021-12-14 ENCOUNTER — Other Ambulatory Visit (HOSPITAL_COMMUNITY): Payer: PRIVATE HEALTH INSURANCE | Attending: Psychiatry | Admitting: Psychiatry
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
## 2021-12-18 ENCOUNTER — Encounter (HOSPITAL_COMMUNITY): Payer: Self-pay | Admitting: Psychiatry

## 2021-12-18 ENCOUNTER — Other Ambulatory Visit (HOSPITAL_COMMUNITY): Payer: PRIVATE HEALTH INSURANCE | Attending: Psychiatry | Admitting: Psychiatry

## 2021-12-18 DIAGNOSIS — F331 Major depressive disorder, recurrent, moderate: Secondary | ICD-10-CM

## 2021-12-18 DIAGNOSIS — F419 Anxiety disorder, unspecified: Secondary | ICD-10-CM | POA: Insufficient documentation

## 2021-12-18 DIAGNOSIS — F32A Depression, unspecified: Secondary | ICD-10-CM | POA: Diagnosis present

## 2021-12-18 NOTE — Plan of Care (Signed)
Pt is an active participant in her treatment plan. ?

## 2021-12-18 NOTE — Progress Notes (Signed)
Virtual Visit via Video Note   I connected with Katherine Blair on 12/18/21 at  9:00 AM EDT by a video enabled telemedicine application and verified that I am speaking with the correct person using two identifiers.   At orientation to the IOP program, Case Manager discussed the limitations of evaluation and management by telemedicine and the availability of in person appointments. The patient expressed understanding and agreed to proceed with virtual visits throughout the duration of the program.   Location:  Patient: Patient Home Provider: [facility]   History of Present Illness: MDD   Observations/Objective: Check In: Case Manager checked in with all participants to review discharge dates, insurance authorizations, work-related documents and needs from the treatment team regarding medications. Katherine Blair stated needs and engaged in discussion.    Initial Therapeutic Activity: Counselor facilitated a check-in with Katherine Blair to assess for safety, sobriety and medication compliance.  Counselor also inquired about Yury's current emotional ratings, as well as any significant changes in thoughts, feelings or behavior since previous check in.  Katherine Blair presented for session on time and was alert, oriented x5, with no evidence or self-report of active SI/HI or A/V H.  Katherine Blair reported compliance with medication and denied use of alcohol or illicit substances.  Katherine Blair reported scores of 10/10 for depression, 8/10 for anxiety, and 3/10 for anger/irritability.  Katherine Blair denied any recent outbursts or panic attacks.  Katherine Blair reported that a recent success was taking time off work to focus on her mental health.  Katherine Blair reported that a recent struggle was having a 'meltdown' at work last week due to high stress, and seeking MHIOP in order to learn new coping skills.  Katherine Blair reported that her goal today is to do some landscaping outside to relax for self-care.         Second Therapeutic Activity: Counselor engaged the group in discussion on  managing work/life balance today to improve mental health and wellness.  Counselor explained how finding balance between responsibilities at home and work place can be challenging, lead to increased stress, and this has been further complicated by recent pandemic leading to unemployment, more virtual work, and blurring of lines between home as a place of rest or work duties.  Counselor facilitated discussion on what challenges members have faced with this issue historically, as well as what, if any, issues have arisen following pandemic. Counselor also discussed strategies for improving work/life balance while members work on their mental health during treatment.  Some of these included keeping track of time management; creating a list of priorities and scaling importance; setting realistic, measurable goals each day; establishing boundaries; taking care of health needs; and nurturing relationships at home and work for support.  Counselor inquired about areas where members feel they are excelling, as well as areas they could focus on during treatment. Intervention was effective, as evidenced by Katherine Blair actively participating in discussion on topic and reporting that recently she was working 12 hour shifts and found this job to be too demanding, and not sustainable, so she recently made the decision to take FMLA to focus more on her mental health and wellbeing.  Katherine Blair stated "After awhile I realized that it was just too much and I had been sacrificing myself for the company".  Katherine Blair reported that experienced several symptoms of burnout, including feelings of self-doubt, tired and drained, detachment from others, overwhelmed, and withdrawing from responsibilities.  Katherine Blair reported that there have also been numerous warning signs such as checking her phone obsessively for  emails even after shifts are over, working through lunch, skipping meals, and feeling resentful about the amount of time spent on work related tasks.  Katherine Blair  was receptive to suggestions offered today for addressing work life imbalance, including keeping an activities time log for the upcoming week to ensure a more realistic schedule, paying closer attention to physical health needs following recent medical diagnosis, setting priorities, planning out her day for a daily to-do list, and setting boundaries to ensure that she can begin leaving work at work.    Assessment and Plan: Counselor recommends that Katherine Blair remain in IOP treatment to better manage mental health symptoms, ensure stability and pursue completion of treatment plan goals. Counselor recommends adherence to crisis/safety plan, taking medications as prescribed, and following up with medical professionals if any issues arise.   Follow Up Instructions: Counselor will send Webex link for next session. Katherine Blair was advised to call back or seek an in-person evaluation if the symptoms worsen or if the condition fails to improve as anticipated.   Collaboration of Care:   Medication Management AEB Dr. Karsten Ro or Hillery Jacks, NP                                          Case Manager AEB Jeri Modena, CNA    Patient/Guardian was advised Release of Information must be obtained prior to any record release in order to collaborate their care with an outside provider. Patient/Guardian was advised if they have not already done so to contact the registration department to sign all necessary forms in order for [place] to release information regarding their care.   Consent: Patient/Guardian gives verbal consent for treatment and assignment of benefits for services provided during this visit. Patient/Guardian expressed understanding and agreed to proceed.  I provided 180 minutes of non-face-to-face time during this encounter.   [name], LCSW, LCAS 12/18/21

## 2021-12-19 ENCOUNTER — Other Ambulatory Visit (HOSPITAL_COMMUNITY)
Payer: PRIVATE HEALTH INSURANCE | Attending: Licensed Clinical Social Worker | Admitting: Licensed Clinical Social Worker

## 2021-12-19 DIAGNOSIS — F331 Major depressive disorder, recurrent, moderate: Secondary | ICD-10-CM

## 2021-12-19 DIAGNOSIS — F419 Anxiety disorder, unspecified: Secondary | ICD-10-CM | POA: Insufficient documentation

## 2021-12-19 DIAGNOSIS — F32A Depression, unspecified: Secondary | ICD-10-CM | POA: Insufficient documentation

## 2021-12-19 NOTE — Progress Notes (Signed)
Virtual Visit via Video Note   I connected with [name] on 12/19/21 at  9:00 AM EDT by a video enabled telemedicine application and verified that I am speaking with the correct person using two identifiers.   At orientation to the IOP program, Case Manager discussed the limitations of evaluation and management by telemedicine and the availability of in person appointments. The patient expressed understanding and agreed to proceed with virtual visits throughout the duration of the program.   Location:  Patient: [facility] Home Provider: [facility]   History of Present Illness: MDD   Observations/Objective: Check In: Case Manager checked in with all participants to review discharge dates, insurance authorizations, work-related documents and needs from the treatment team regarding medications. Kyndahl stated needs and engaged in discussion.    Initial Therapeutic Activity: Counselor facilitated a check-in with Emmajo to assess for safety, sobriety and medication compliance.  Counselor also inquired about Muntaha's current emotional ratings, as well as any significant changes in thoughts, feelings or behavior since previous check in.  Barbarita presented for session on time and was alert, oriented x5, with no evidence or self-report of active SI/HI or A/V H.  Meggen reported compliance with medication and denied use of alcohol or illicit substances.  Ajeenah reported scores of 8/10 for depression, 10/10 for anxiety, and 0/10 for anger/irritability.  Jameca denied any recent outbursts or panic attacks.  Keyri reported that a recent success was enjoying her first day of group yesterday, stating "It really helped me to feel better yesterday.  It felt good to have someone listen and empathize". She reported that as a result, she focused more on self-care that afternoon, relaxing and catching up with family that visited.  Nyellie reported that a struggle was waking up late this morning and feeling anxious as a result.  Jenni  reported that her goal today is to take care of some tasks around the home like laundry to be productive, but also ensure time for restful activities such as watching a new TV show.         Second Therapeutic Activity: Counselor introduced [employer], [place] Chaplain to provide psychoeducation on topic of Grief and Loss with members today.  Marchelle Folks began discussion by checking in with the group about their baseline mood today, general thoughts on what grief means to them and how it has affected them personally in the past.  Marchelle Folks provided information on how the process of grief/loss can differ depending upon one's unique culture, and categories of loss one could experience (i.e. loss of a person, animal, relationship, job, identity, etc).  Marchelle Folks encouraged members to be mindful of how pervasive loss can be, and how to recognize signs which could indicate that this is having an impact on one's overall mental health and wellbeing.  Intervention was effective, as evidenced by [name] participating in discussion with speaker on the subject, reporting that she has lost friendships over the years, and a former partner, which she does not feel like she has properly grieved, stating "I went through the process with my kids, but it feels like I didn't have the right or time to process it myself".  [place] was receptive to feedback from chaplain on strategies for facilitating healthy grieving process.      Third Therapeutic Activity: Counselor covered topic of attachment styles today.  Counselor virtually shared a handout with the group on this topic which defined attachment styles as how people think about and behave in relationships.  Styles  were broken down by category, including secure attachment where one believes close relationships are trustworthy, compared to insecure attachment (i.e. anxious, avoidant, or anxious-avoidant) where one is distrusting or worries about their bond with others.  Counselor inquired  about which attachment style members most related to, how this has influenced their mental health/well-being, and whether they intend to begin making any changes.  Intervention was effective, as evidenced by [name] participating in discussion, and reporting that she most identified with the anxious/avoidant attachment style, as she has secure relationships with her children, but in romantic relationships she tends to feel low self-worth, worries about abandonment, is overly sensitive to criticism, and hungry for approval.  Josseline reported that this has led to several consequences, stating "I would have relationships where I rush into them, wanting someone or needing them in my life, and then all the feelings and emotions would come about, so I would run away.  I never felt very secure".  Sadye reported that she plans to continue with therapy to help resolve attachment issues, noting that she is in a new, healthier relationship now where she feels more secure, and able to express her feelings more openly and honestly.     Assessment and Plan: Counselor recommends that Brock remain in IOP treatment to better manage mental health symptoms, ensure stability and pursue completion of treatment plan goals. Counselor recommends adherence to crisis/safety plan, taking medications as prescribed, and following up with medical professionals if any issues arise.   Follow Up Instructions: Counselor will send Webex link for next session. Ovie was advised to call back or seek an in-person evaluation if the symptoms worsen or if the condition fails to improve as anticipated.   Collaboration of Care:   Medication Management AEB Dr. Karsten Ro or Hillery Jacks, NP                                          Case Manager AEB Jeri Modena, CNA    Patient/Guardian was advised Release of Information must be obtained prior to any record release in order to collaborate their care with an outside provider. Patient/Guardian was advised if  they have not already done so to contact the registration department to sign all necessary forms in order for [place] to release information regarding their care.   Consent: Patient/Guardian gives verbal consent for treatment and assignment of benefits for services provided during this visit. Patient/Guardian expressed understanding and agreed to proceed.  I provided 180 minutes of non-face-to-face time during this encounter.   [name], LCSW, LCAS 12/19/21

## 2021-12-20 ENCOUNTER — Other Ambulatory Visit (HOSPITAL_COMMUNITY): Payer: PRIVATE HEALTH INSURANCE | Attending: Psychiatry | Admitting: Psychiatry

## 2021-12-20 ENCOUNTER — Encounter (HOSPITAL_COMMUNITY): Payer: Self-pay | Admitting: Licensed Clinical Social Worker

## 2021-12-20 DIAGNOSIS — Z79899 Other long term (current) drug therapy: Secondary | ICD-10-CM | POA: Insufficient documentation

## 2021-12-20 DIAGNOSIS — F17210 Nicotine dependence, cigarettes, uncomplicated: Secondary | ICD-10-CM | POA: Insufficient documentation

## 2021-12-20 DIAGNOSIS — J449 Chronic obstructive pulmonary disease, unspecified: Secondary | ICD-10-CM | POA: Diagnosis not present

## 2021-12-20 DIAGNOSIS — K219 Gastro-esophageal reflux disease without esophagitis: Secondary | ICD-10-CM | POA: Insufficient documentation

## 2021-12-20 DIAGNOSIS — K2270 Barrett's esophagus without dysplasia: Secondary | ICD-10-CM | POA: Insufficient documentation

## 2021-12-20 DIAGNOSIS — K7460 Unspecified cirrhosis of liver: Secondary | ICD-10-CM | POA: Insufficient documentation

## 2021-12-20 DIAGNOSIS — F331 Major depressive disorder, recurrent, moderate: Secondary | ICD-10-CM | POA: Insufficient documentation

## 2021-12-20 DIAGNOSIS — F419 Anxiety disorder, unspecified: Secondary | ICD-10-CM | POA: Insufficient documentation

## 2021-12-20 MED ORDER — SERTRALINE HCL 50 MG PO TABS: 50.0000 mg | ORAL_TABLET | Freq: Every day | ORAL | 1 refills | Status: DC

## 2021-12-20 NOTE — Progress Notes (Signed)
Virtual Visit via Video Note   I connected with [name] on 12/20/21 at  9:00 AM EDT by a video enabled telemedicine application and verified that I am speaking with the correct person using two identifiers.   At orientation to the IOP program, Case Manager discussed the limitations of evaluation and management by telemedicine and the availability of in person appointments. The patient expressed understanding and agreed to proceed with virtual visits throughout the duration of the program.   Location:  Patient: [facility] Home Provider: [facility]   History of Present Illness: MDD   Observations/Objective: Check In: Case Manager checked in with all participants to review discharge dates, insurance authorizations, work-related documents and needs from the treatment team regarding medications. Laiklynn stated needs and engaged in discussion.    Initial Therapeutic Activity: Counselor facilitated a check-in with Tyquisha to assess for safety, sobriety and medication compliance.  Counselor also inquired about Amadi's current emotional ratings, as well as any significant changes in thoughts, feelings or behavior since previous check in.  Dara presented for session on time and was alert, oriented x5, with no evidence or self-report of active SI/HI or A/V H.  Jillisa reported compliance with medication and denied use of alcohol or illicit substances.  Kimberleigh reported scores of 9/10 for depression, 4/10 for anxiety, and 0/10 for anger/irritability.  Itha denied any recent panic attacks.  Lyn reported that a recent struggle was "Having an incident at dinner last night when I made a huge mess and had an outburst".  Navy reported that she believed this was triggered by overwhelming herself with too many tasks yesterday.  Tallie reported that her goal today is to take things slower this morning in order to avoid stressing herself out again, and eventually get outside of the home to visit her daughter and grandchildren.        Second Therapeutic Activity: Counselor introduced [name], [employer] Pharmacist, to provide psychoeducation on topic of medication compliance with members today.  [name] provided psychoeducation on classes of medications such as antidepressants, antipsychotics, what symptoms they are intended to treat, and any side effects one might encounter while on a particular prescription.  Time was allowed for clients to ask any questions they might have of [facility] regarding this specialty.  Intervention was effective, as evidenced by [name] participating in discussion with speaker on the subject, reporting that she is currently on a low dose of Celexa, but wonders if Zoloft might be more effective to address current symptoms.  Oneika was receptive to feedback from pharmacist on how these medications are intended to treat depression, notable differences, and pros/cons of each.    Third Therapeutic Activity: Psycho-educational portion of group was co-facilitated by wellness director (David Stall, MS, MPH, [facility]) focused on self-care in daily life. Facilitator and group members discussed presented materials regarding importance of sleep, diet, and exercise. Group members discussed any changes they are willing to make to improve an area of self-care in their lives (physical, psychological, emotional, spiritual, relationship, professional) to improve overall mental health as they continue with treatment.  Intervention was effective, as evidenced by [name] participating in discussion with speaker on the subject, reporting that one aspect of wellness she would like to focus on improving is getting more exercise while she is out of work, as this had been neglected due to her busy schedule and lack of motivation stemming from depression.  [place] enjoyable  to walk through in the afternoon or evening when it is cooler in temperature.  Rinda reported that she would also  like to improve her diet by cutting back on overall caffeine and sugar consumption, which would help with diabetes management.     Assessment and Plan: Counselor recommends that Jaxsyn remain in IOP treatment to better manage mental health symptoms, ensure stability and pursue completion of treatment plan goals. Counselor recommends adherence to crisis/safety plan, taking medications as prescribed, and following up with medical professionals if any issues arise.   Follow Up Instructions: Counselor will send Webex link for next session. Payal was advised to call back or seek an in-person evaluation if the symptoms worsen or if the condition fails to improve as anticipated.   Collaboration of Care:   Medication Management AEB Dr. Karsten Ro or Hillery Jacks, NP                                          Case Manager AEB Jeri Modena, CNA    Patient/Guardian was advised Release of Information must be obtained prior to any record release in order to collaborate their care with an outside provider. Patient/Guardian was advised if they have not already done so to contact the registration department to sign all necessary forms in order for [place] to release information regarding their care.   Consent: Patient/Guardian gives verbal consent for treatment and assignment of benefits for services provided during this visit. Patient/Guardian expressed understanding and agreed to proceed.  I provided 180 minutes of non-face-to-face time during this encounter.   [name], [place], LCAS 12/20/21

## 2021-12-20 NOTE — Progress Notes (Signed)
Virtual Visit via Video Note  I connected with Katherine Blair on 12/20/21 at  9:00 AM EDT by a video enabled telemedicine application and verified that I am speaking with the correct person using two identifiers.  Location: Patient: Home Provider: Office   I discussed the limitations of evaluation and management by telemedicine and the availability of in person appointments. The patient expressed understanding and agreed to proceed.   I discussed the assessment and treatment plan with the patient. The patient was provided an opportunity to ask questions and all were answered. The patient agreed with the plan and demonstrated an understanding of the instructions.   The patient was advised to call back or seek an in-person evaluation if the symptoms worsen or if the condition fails to improve as anticipated.  I provided 15 minutes of non-face-to-face time during this encounter.   Oneta Rack, NP   Virtual Visit via Video Note  I connected with Katherine Blair on 12/20/21 at  9:00 AM EDT by a video enabled telemedicine application and verified that I am speaking with the correct person using two identifiers.  Location: Patient: Home Provider: Office   I discussed the limitations of evaluation and management by telemedicine and the availability of in person appointments. The patient expressed understanding and agreed to proceed.    I discussed the assessment and treatment plan with the patient. The patient was provided an opportunity to ask questions and all were answered. The patient agreed with the plan and demonstrated an understanding of the instructions.   The patient was advised to call back or seek an in-person evaluation if the symptoms worsen or if the condition fails to improve as anticipated.  I provided 15 minutes of non-face-to-face time during this encounter.   Oneta Rack, NP     Psychiatric Initial Adult Assessment   Patient Identification: Katherine Blair MRN:   [id number] Date of Evaluation:  12/20/2021 Referral Source: [facility] urgent care Chief Complaint: " I had a mental breakdown"  Visit Diagnosis:    ICD-10-CM   1. Major depressive disorder, recurrent episode, moderate (HCC)  F33.1       History of Present Illness: Katherine Blair 56 year old Caucasian female presents due to multiple life stressors.  Reports she was recently diagnosed with cirrhosis of the liver.  States her husband passed away from the states same diagnosis. she reports worsening depression and anxiety related to the news she received by her primary care provider.  Reports feels overwhelmed at work.  States she takes on more more tasks as people leave the company.  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 Katherine Blair reports "I feel like my company is crumbling from the top" reports working 16-hour days.  States she works from a hotel room in [place] most weeks.   Katherine Blair was recently seen and evaluated at [facility] urgent care facility.  She was started on Celexa 20 mg which she reports she does not feel it is helping her mood.  Reports she was prescribed Zoloft in the past which she felt helped her mood a little better.  Discussed reinitiating Zoloft.  She was receptive to plan.  Reports a history of substance abuse with alcohol.  Reports 1 previous inpatient admission to [facility]. "  Looking back that was a febrile attempt."  Denies that she is followed by therapy and/or psychiatry currently.  Patient to start intensive outpatient programming 12/18/2021.  Per initial admission assessment note:"Katherine Blair would like to follow-up with psychiatry as she  feels like she "had a meltdown today because of the combination of things."  Recent stressors include ongoing health concerns for approximately 6 months.  She was notified she had been diagnosed with cirrhosis of the liver 1 month ago, notified today that her "Barrett's esophagus is back."  She has minimized her concerns about her overall  physical health to her family in an effort to decrease their worry.She would like to have time off work "to process."  She is concerned that if she asked for an extended time off she will not be approved.  She states "I cannot afford to leave a job because I need my insurance."Patient endorses symptoms of depression ongoing for approximately 6 months.  She reports low energy, fatigue, depressed mood, decreased sleep."  Associated Signs/Symptoms: Depression Symptoms:  depressed mood, feelings of worthlessness/guilt, difficulty concentrating, anxiety, (Hypo) Manic Symptoms:  Distractibility, Anxiety Symptoms:  Excessive Worry, Psychotic Symptoms:  Hallucinations: None PTSD Symptoms: NA  Past Psychiatric History: Previous inpatient admission at [facility].  Reported she has been taking Zoloft in the past when she resided in [place].  Previous Psychotropic Medications: Yes   Substance Abuse History in the last 12 months:  Yes.    Consequences of Substance Abuse: NA  Past Medical History:  Past Medical History:  Diagnosis Date   COPD (chronic obstructive pulmonary disease) (HCC)    Cushing's syndrome (HCC)    Diabetes mellitus without complication (HCC)    GERD (gastroesophageal reflux disease)    Hypertension    Thyroid disease     Past Surgical History:  Procedure Laterality Date   BLADDER SURGERY     CESAREAN SECTION     DILATION AND CURETTAGE OF UTERUS     PARTIAL HYSTERECTOMY     TUBAL LIGATION      Family Psychiatric History:   Family History:  Family History  Problem Relation Age of Onset   Diabetes Mother    Bipolar disorder Father    Diabetes Father    Drug abuse Sister    Diabetes Sister     Social History:   Social History   Socioeconomic History   Marital status: Single    Spouse name: Not on file   Number of children: Not on file   Years of education: Not on file   Highest education level: Not on file  Occupational History   Not on file   Tobacco Use   Smoking status: Every Day    Packs/day: 1.00    Years: 30.00    Total pack years: 30.00    Types: Cigarettes   Smokeless tobacco: Never  Vaping Use   Vaping Use: Never used  Substance and Sexual Activity   Alcohol use: Yes    Comment: social   Drug use: No   Sexual activity: Not on file  Other Topics Concern   Not on file  Social History Narrative   Not on file   Social Determinants of Health   Financial Resource Strain: Not on file  Food Insecurity: Not on file  Transportation Needs: Not on file  Physical Activity: Not on file  Stress: Not on file  Social Connections: Not on file    Additional Social History: Reports drinking socially however has stopped drinking since recent diagnosis of cirrhosis.  Allergies:   Allergies  Allergen Reactions   Penicillins Rash    Metabolic Disorder Labs: Lab Results  Component Value Date   HGBA1C 6.7 (A) 09/24/2019   No results found for: "PROLACTIN" No  results found for: "CHOL", "TRIG", "HDL", "CHOLHDL", "VLDL", "LDLCALC" Lab Results  Component Value Date   TSH 2.45 03/31/2019    Therapeutic Level Labs: No results found for: "LITHIUM" No results found for: "CBMZ" No results found for: "VALPROATE"  Current Medications: Current Outpatient Medications  Medication Sig Dispense Refill   sertraline (ZOLOFT) 50 MG tablet Take 1 tablet (50 mg total) by mouth daily. 30 tablet 1   atorvastatin (LIPITOR) 20 MG tablet Take 20 mg by mouth at bedtime.     dicyclomine (BENTYL) 20 MG tablet Take 1 tablet (20 mg total) by mouth 4 (four) times daily as needed for spasms. 20 tablet 0   Dulaglutide (TRULICITY) 4.5 MG/0.5ML SOPN Inject 4.5 mg into the skin once a week. 6 mL 3   GABAPENTIN PO Take by mouth.     insulin glargine, 2 Unit Dial, (TOUJEO MAX SOLOSTAR) 300 UNIT/ML Solostar Pen Inject 110 Units into the skin every morning. 42 mL 3   Insulin Pen Needle (PEN NEEDLES) 32G X 5 MM MISC 1 Device by Does not apply route  daily. 90 each 3   omeprazole (PRILOSEC) 20 MG capsule Take 20 mg by mouth daily.     ondansetron (ZOFRAN ODT) 8 MG disintegrating tablet Take 1 tablet (8 mg total) by mouth every 8 (eight) hours as needed for nausea or vomiting. 10 tablet 1   PHENTERMINE HCL PO Take by mouth.     No current facility-administered medications for this visit.    Musculoskeletal: Strength & Muscle Tone: within normal limits Gait & Station: normal Patient leans: N/A  Psychiatric Specialty Exam: Review of Systems  There were no vitals taken for this visit.There is no height or weight on file to calculate BMI.  General Appearance: Casual  Eye Contact:  Good  Speech:  Clear and Coherent  Volume:  Normal  Mood:  Anxious and Depressed  Affect:  Appropriate  Thought Process:  Coherent  Orientation:  Full (Time, Place, and Person)  Thought Content:  Logical  Suicidal Thoughts:  No  Homicidal Thoughts:  No  Memory:  Immediate;   Good Recent;   Good  Judgement:  Good  Insight:  Good  Psychomotor Activity:  Normal  Concentration:  Concentration: Good  Recall:  Good  Fund of Knowledge:Good  Language: Good  Akathisia:  No  Handed:  Right  AIMS (if indicated):  done  Assets:  Communication Skills Desire for Improvement  ADL's:  Intact  Cognition: WNL  Sleep:  NA   Screenings: Oceanographer Row Counselor from 12/14/2021 in BEHAVIORAL HEALTH INTENSIVE PSYCH  PHQ-2 Total Score 5  PHQ-9 Total Score 17      Flowsheet Row Counselor from 12/14/2021 in BEHAVIORAL HEALTH INTENSIVE PSYCH ED from 12/13/2021 in [facility]  C-SSRS RISK CATEGORY Error: Question 6 not populated No Risk       Assessment and Plan: Patient to start intensive outpatient programming Ininatied Zoloft 50 mg daily Discontinue Celexa   Collaboration of Care: Medication Management AEB initiated Zoloft 50 mg daily  Patient/Guardian was advised Release of Information must be obtained prior to  any record release in order to collaborate their care with an outside provider. Patient/Guardian was advised if they have not already done so to contact the registration department to sign all necessary forms in order for [place] to release information regarding their care.   Consent: Patient/Guardian gives verbal consent for treatment and assignment of benefits for services provided during this visit.  Patient/Guardian expressed understanding and agreed to proceed.   Oneta Rack, NP 8/23/202310:40 AM

## 2021-12-21 ENCOUNTER — Other Ambulatory Visit (HOSPITAL_COMMUNITY): Payer: PRIVATE HEALTH INSURANCE | Attending: Psychiatry | Admitting: Licensed Clinical Social Worker

## 2021-12-21 DIAGNOSIS — F329 Major depressive disorder, single episode, unspecified: Secondary | ICD-10-CM | POA: Diagnosis present

## 2021-12-21 DIAGNOSIS — F331 Major depressive disorder, recurrent, moderate: Secondary | ICD-10-CM

## 2021-12-21 NOTE — Progress Notes (Signed)
Virtual Visit via Video Note   I connected with [name] on 12/21/21 at  9:00 AM EDT by a video enabled telemedicine application and verified that I am speaking with the correct person using two identifiers.   At orientation to the IOP program, Case Manager discussed the limitations of evaluation and management by telemedicine and the availability of in person appointments. The patient expressed understanding and agreed to proceed with virtual visits throughout the duration of the program.   Location:  Patient: Patient Home Provider: OPT BH Office   History of Present Illness: MDD   Observations/Objective: Check In: Case Manager checked in with all participants to review discharge dates, insurance authorizations, work-related documents and needs from the treatment team regarding medications. Harshita stated needs and engaged in discussion.    Initial Therapeutic Activity: Counselor facilitated a check-in with Breona to assess for safety, sobriety and medication compliance.  Counselor also inquired about Adileny's current emotional ratings, as well as any significant changes in thoughts, feelings or behavior since previous check in.  Ahilyn presented for session on time and was alert, oriented x5, with no evidence or self-report of active SI/HI or A/V H.  Sheba reported compliance with medication and denied use of alcohol or illicit substances.  Makynzee reported scores of 5/10 for depression, 1/10 for anxiety, and 0/10 for anger/irritability.  Keeleigh denied any recent outbursts or panic attacks.  Adisen reported that a recent success was making her bed, sweeping the floor, and cleaning up the kitchen yesterday to be productive.  She also reported that she spent the evening with her granddaughter.  Shynice denied any new struggles at this time.  Elyanna reported that her goal today is to take time to rest following how busy her day was yesterday.       Second Therapeutic Activity: Counselor introduced topic of self-esteem  today and defined this as the value an individual places on oneself, based upon assessment of personal worth as a human being and approval/disapproval of one's behavior. Counselor asked members to assess their level of self-esteem at this time based upon common indicators of high self-esteem, including: accepting oneself unconditionally;  having self-respect and deep seated belief that one matters; being unaffected by other people's opinions/criticisms; and showing good control over emotions.  Counselor also explained concept of one's inner critic which serves to highlight faults and minimize strengths, directly influencing low sense of self-esteem.  Counselor then provided handout on 'strengths and qualities', which featured questions to guide discussion and increase awareness of each member's unique individual abilities which could reinforce higher self-esteem. Examples of questions included: 'things I am good at', 'challenges I have overcome', and 'what I like about myself'.  Intervention was effective, as evidenced by [name] actively engaging in discussion on topic, and completing a self-esteem assessment, receiving a score of 5 which indicated a 'moderate' level of self-esteem at this time due to traits such as becoming an Forensic psychologist in life, being passive in interactions with others, blaming others for her problems, seeking the approval of others, and using addictive behaviors to cope with painful emotions.  Glendine stated "I'm realizing now how much my job affects my self-esteem.  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  Am I willing to sell my soul and mental health for the salary?"  Kimla was receptive to several strategies offered today for increasing self-esteem during treatment, including being more consistent with self-care activities such as watching her diet  more closely or working out regularly, looking into starting her own business in order to increase  independence and self-fulfillment, keeping a record of her accomplishments, such as trophies or awards for hard work, Museum/gallery curator and utilization of strengths such as wisdom, leadership, empathy, honesty, open mindedness, persistence, kindness; and asking for feedback from people who know/appreciate her such as trusted Acupuncturist.    Assessment and Plan: Counselor recommends that Ramiyah remain in IOP treatment to better manage mental health symptoms, ensure stability and pursue completion of treatment plan goals. Counselor recommends adherence to crisis/safety plan, taking medications as prescribed, and following up with medical professionals if any issues arise.   Follow Up Instructions: Counselor will send Webex link for next session. Nekeisha was advised to call back or seek an in-person evaluation if the symptoms worsen or if the condition fails to improve as anticipated.   Collaboration of Care:   Medication Management AEB Dr. Karsten Ro or Hillery Jacks, NP                                          Case Manager AEB Jeri Modena, CNA    Patient/Guardian was advised Release of Information must be obtained prior to any record release in order to collaborate their care with an outside provider. Patient/Guardian was advised if they have not already done so to contact the registration department to sign all necessary forms in order for [place] to release information regarding their care.   Consent: Patient/Guardian gives verbal consent for treatment and assignment of benefits for services provided during this visit. Patient/Guardian expressed understanding and agreed to proceed.  I provided 180 minutes of non-face-to-face time during this encounter.   [name], LCSW, LCAS 12/21/21

## 2021-12-22 ENCOUNTER — Other Ambulatory Visit (HOSPITAL_COMMUNITY): Payer: PRIVATE HEALTH INSURANCE | Attending: Psychiatry | Admitting: Licensed Clinical Social Worker

## 2021-12-22 DIAGNOSIS — F331 Major depressive disorder, recurrent, moderate: Secondary | ICD-10-CM

## 2021-12-22 DIAGNOSIS — F329 Major depressive disorder, single episode, unspecified: Secondary | ICD-10-CM | POA: Insufficient documentation

## 2021-12-22 NOTE — Progress Notes (Signed)
Virtual Visit via Video Note   I connected with [name] on 12/22/21 at  9:00 AM EDT by a video enabled telemedicine application and verified that I am speaking with the correct person using two identifiers.   At orientation to the IOP program, Case Manager discussed the limitations of evaluation and management by telemedicine and the availability of in person appointments. The patient expressed understanding and agreed to proceed with virtual visits throughout the duration of the program.   Location:  Patient: Patient Home Provider: Counselor Home Office   History of Present Illness: MDD   Observations/Objective: Check In: Case Manager checked in with all participants to review discharge dates, insurance authorizations, work-related documents and needs from the treatment team regarding medications. Britlee stated needs and engaged in discussion.    Initial Therapeutic Activity: Counselor facilitated a check-in with Ariaunna to assess for safety, sobriety and medication compliance.  Counselor also inquired about Christianne's current emotional ratings, as well as any significant changes in thoughts, feelings or behavior since previous check in.  Aunya presented for session on time and was alert, oriented x5, with no evidence or self-report of active SI/HI or A/V H.  Mattalyn reported compliance with medication and denied use of alcohol or illicit substances.  Jamine reported scores of 4/10 for depression, 2/10 for anxiety, and 0/10 for anger/irritability.  Tannisha denied any recent outbursts or panic attacks.  Alexismarie reported that a recent struggle was experiencing a migraine yesterday which prevented her from getting much done besides resting.  She reported that she has been feeling under the weather today too, stating "I just don't feel good".  Liesel reported that a success was setting boundaries with coworkers while she rested, stating "I'm learning to say 'No'".  Laurann reported that her goal this weekend is to spend time  with her kids, and have a celebration for her partner getting a promotion.         Second Therapeutic Activity: Counselor introduced topic of building a social support network today.  Counselor explained how this can be defined as having a having a group of healthy people in one's life you can talk to, spend time with, and get help from to improve both mental and physical health.  Counselor noted that some barriers can make it difficult to connect with other people, including the presence of anxiety or depression, or moving to an unfamiliar area.  Group members were asked to assess the current state of their support network, and identify ways that this could be improved.  Tips were given on how to address previously noted barriers, such as strengthening social skills, using relaxation techniques to reduce anxiety, scheduling social time each week, and/or exploring social events nearby which could increase chances of meeting new supports.  Members were also encouraged to consider getting closer to people they already know through suggestions such as outreaching someone by text, email or phone call if they haven't spoken in awhile, doing something nice for a friend/family member unexpectedly, and/or inviting someone over for a game/movie/dinner night.  Intervention was effective, as evidenced by [name] actively participating in discussion on the subject, and reporting that some of her barriers to establishing a good support network include social anxiety, lack of self-esteem, and not having time for new friendships due to her busy work schedule, which also requires a lot of travel.  Faithlyn reported that her goal is to set boundaries with her job in order to have more time for friendships and self-care activities  that include them.  She expressed interest in several suggestions offered today, such as going on MeetUps to find social events where she can make new connections, volunteering with organizations that share her  values and have meaningful causes, in addition to seeking a new church which is supportive and does not stigmatize issues important to her.    Assessment and Plan: Counselor recommends that Alycea remain in IOP treatment to better manage mental health symptoms, ensure stability and pursue completion of treatment plan goals. Counselor recommends adherence to crisis/safety plan, taking medications as prescribed, and following up with medical professionals if any issues arise.   Follow Up Instructions: Counselor will send Webex link for next session. Shawnita was advised to call back or seek an in-person evaluation if the symptoms worsen or if the condition fails to improve as anticipated.   Collaboration of Care:   Medication Management AEB Dr. Karsten Ro or Hillery Jacks, NP                                          Case Manager AEB Jeri Modena, CNA    Patient/Guardian was advised Release of Information must be obtained prior to any record release in order to collaborate their care with an outside provider. Patient/Guardian was advised if they have not already done so to contact the registration department to sign all necessary forms in order for [place] to release information regarding their care.   Consent: Patient/Guardian gives verbal consent for treatment and assignment of benefits for services provided during this visit. Patient/Guardian expressed understanding and agreed to proceed.  I provided 165 minutes of non-face-to-face time during this encounter.   [name], LCSW, LCAS 12/22/21

## 2021-12-25 ENCOUNTER — Other Ambulatory Visit: Payer: Self-pay

## 2021-12-25 ENCOUNTER — Telehealth (HOSPITAL_COMMUNITY): Payer: Self-pay | Admitting: Psychiatry

## 2021-12-25 ENCOUNTER — Emergency Department (HOSPITAL_COMMUNITY)
Admission: EM | Admit: 2021-12-25 | Discharge: 2021-12-25 | Disposition: A | Discharge status: Home / Self Care | Payer: 59 | Attending: Emergency Medicine | Admitting: Emergency Medicine

## 2021-12-25 ENCOUNTER — Ambulatory Visit (HOSPITAL_COMMUNITY): Payer: PRIVATE HEALTH INSURANCE | Admitting: Psychiatry

## 2021-12-25 ENCOUNTER — Emergency Department (HOSPITAL_COMMUNITY): Payer: 59

## 2021-12-25 ENCOUNTER — Encounter (HOSPITAL_COMMUNITY): Payer: Self-pay

## 2021-12-25 DIAGNOSIS — M546 Pain in thoracic spine: Secondary | ICD-10-CM | POA: Insufficient documentation

## 2021-12-25 DIAGNOSIS — E119 Type 2 diabetes mellitus without complications: Secondary | ICD-10-CM | POA: Insufficient documentation

## 2021-12-25 DIAGNOSIS — G8929 Other chronic pain: Secondary | ICD-10-CM | POA: Insufficient documentation

## 2021-12-25 DIAGNOSIS — M545 Low back pain, unspecified: Secondary | ICD-10-CM | POA: Insufficient documentation

## 2021-12-25 DIAGNOSIS — I10 Essential (primary) hypertension: Secondary | ICD-10-CM | POA: Insufficient documentation

## 2021-12-25 DIAGNOSIS — J449 Chronic obstructive pulmonary disease, unspecified: Secondary | ICD-10-CM | POA: Insufficient documentation

## 2021-12-25 MED ORDER — DIAZEPAM 5 MG PO TABS
5.0000 mg | ORAL_TABLET | Freq: Once | ORAL | Status: AC
  Administered 2021-12-25: 5 mg via ORAL
  Filled 2021-12-25: qty 1

## 2021-12-25 MED ORDER — LIDOCAINE 5 % EX PTCH
1.0000 | MEDICATED_PATCH | CUTANEOUS | Status: DC
  Administered 2021-12-25: 1 via TRANSDERMAL
  Filled 2021-12-25: qty 1

## 2021-12-25 MED ORDER — LIDOCAINE 5 % EX PTCH: 1.0000 | MEDICATED_PATCH | CUTANEOUS | 0 refills | Status: DC

## 2021-12-25 MED ORDER — HYDROCODONE-ACETAMINOPHEN 5-325 MG PO TABS
1.0000 | ORAL_TABLET | Freq: Once | ORAL | Status: AC
  Administered 2021-12-25: 1 via ORAL
  Filled 2021-12-25: qty 1

## 2021-12-25 MED ORDER — HYDROCODONE-ACETAMINOPHEN 5-325 MG PO TABS: 2.0000 | ORAL_TABLET | Freq: Four times a day (QID) | ORAL | 0 refills | Status: DC | PRN

## 2021-12-25 MED ORDER — METHOCARBAMOL 500 MG PO TABS: 500.0000 mg | ORAL_TABLET | Freq: Two times a day (BID) | ORAL | 0 refills | Status: DC

## 2021-12-25 MED ORDER — KETOROLAC TROMETHAMINE 15 MG/ML IJ SOLN
15.0000 mg | Freq: Once | INTRAMUSCULAR | Status: DC
  Filled 2021-12-25: qty 1

## 2021-12-25 MED ORDER — KETOROLAC TROMETHAMINE 15 MG/ML IJ SOLN
15.0000 mg | Freq: Once | INTRAMUSCULAR | Status: AC
  Administered 2021-12-25: 15 mg via INTRAMUSCULAR

## 2021-12-25 MED ORDER — NAPROXEN 375 MG PO TABS: 375.0000 mg | ORAL_TABLET | Freq: Two times a day (BID) | ORAL | 0 refills | Status: DC

## 2021-12-25 NOTE — Discharge Instructions (Addendum)
Your back pain is most likely due to a muscular strain.  There is been a lot of research on back pain, unfortunately the only thing that seems to really help is Tylenol and ibuprofen.  Relative rest is also important to not lift greater than 10 pounds bending or twisting at the waist.  Please follow-up with your family physician.  The other thing that really seems to benefit patients is physical therapy which your doctor may send you for.  Please return to the emergency department for new numbness or weakness to your arms or legs. Difficulty with urinating or urinating or pooping on yourself.  Also if you cannot feel toilet paper when you wipe or get a fever.  ? ? ?

## 2021-12-25 NOTE — ED Provider Triage Note (Signed)
Emergency Medicine Provider Triage Evaluation Note  Katherine Blair , a 56 y.o. female  was evaluated in triage.  Pt complains of mid low back pain since yesterday morning.  Denies fever, chills, history of IV drug use, history of malignancy, bladder, or bowel dysfunction, saddle anesthesia.  Has history of sciatica on the left side.  States this does not feel like it.  Has taken gabapentin without relief.  Review of Systems  Positive: As above Negative: As above  Physical Exam  BP 133/76 (BP Location: Right Arm)   Pulse (!) 102   Temp 97.7 F (36.5 C)   Resp 16   Ht 5\' 6"  (1.676 m)   Wt 87.1 kg   SpO2 96%   BMI 30.99 kg/m  Gen:   Awake, no distress   Resp:  Normal effort  MSK:   Moves extremities without difficulty  Other:  Lumbar spinal process tenderness present.   Medical Decision Making  Medically screening exam initiated at 9:14 AM.  Appropriate orders placed.  Katherine Blair was informed that the remainder of the evaluation will be completed by another provider, this initial triage assessment does not replace that evaluation, and the importance of remaining in the ED until their evaluation is complete.     [name], PA-C 12/25/21 [phone]

## 2021-12-25 NOTE — ED Triage Notes (Signed)
Complains of lower back pain reports can't hardly move.  Reports it started yesterday morning.  Reports it is in the center of her spine and it is a different type of back pain.  Spinal tenderness noted.  Denies numbness or tingling.

## 2021-12-25 NOTE — ED Provider Notes (Signed)
[facility] EMERGENCY DEPARTMENT Provider Note   CSN: [id number] Arrival date & time: 12/25/21  2355     History  Chief Complaint  Patient presents with   Back Pain    Katherine Blair is a 56 y.o. female.  56 year old female with past medical history significant for low back pain presents today for evaluation of mid low back pain since yesterday morning.  States this is atypical for her chronic low back pain as this is mid back and typically she has left-sided low back pain associated with sciatica.  States this pain does not radiate.  Denies recent injury.  Is without fever, history of IV drug use, loss bowel or bladder control, malignancy, or unintentional weight loss.  She has not taken anything prior to arrival.  The history is provided by the patient. No language interpreter was used.       Home Medications Prior to Admission medications   Medication Sig Start Date End Date Taking? Authorizing Provider  atorvastatin (LIPITOR) 20 MG tablet Take 20 mg by mouth at bedtime.    [provider]  dicyclomine (BENTYL) 20 MG tablet Take 1 tablet (20 mg total) by mouth 4 (four) times daily as needed for spasms. 12/04/19   Molpus, John, MD  Dulaglutide (TRULICITY) 4.5 MG/0.5ML SOPN Inject 4.5 mg into the skin once a week. 03/16/20   Romero Belling, MD  GABAPENTIN PO Take by mouth.    [provider]  insulin glargine, 2 Unit Dial, (TOUJEO MAX SOLOSTAR) 300 UNIT/ML Solostar Pen Inject 110 Units into the skin every morning. 03/16/20   Romero Belling, MD  Insulin Pen Needle (PEN NEEDLES) 32G X 5 MM MISC 1 Device by Does not apply route daily. 09/24/19   Romero Belling, MD  omeprazole (PRILOSEC) 20 MG capsule Take 20 mg by mouth daily.    [provider]  ondansetron (ZOFRAN ODT) 8 MG disintegrating tablet Take 1 tablet (8 mg total) by mouth every 8 (eight) hours as needed for nausea or vomiting. 12/04/19   Molpus, John, MD  PHENTERMINE HCL PO Take by mouth.     [provider]  sertraline (ZOLOFT) 50 MG tablet Take 1 tablet (50 mg total) by mouth daily. 12/20/21 12/20/22  Oneta Rack, NP      Allergies    Penicillins    Review of Systems   Review of Systems  Constitutional:  Negative for chills and fever.  Gastrointestinal:  Negative for abdominal pain and nausea.  Genitourinary:  Negative for difficulty urinating and dysuria.  Musculoskeletal:  Positive for back pain.  All other systems reviewed and are negative.   Physical Exam Updated Vital Signs BP 128/78 (BP Location: Right Arm)   Pulse 76   Temp 97.7 F (36.5 C)   Resp 16   Ht 5\' 6"  (1.676 m)   Wt 87.1 kg   SpO2 96%   BMI 30.99 kg/m  Physical Exam Vitals and nursing note reviewed.  Constitutional:      General: She is not in acute distress.    Appearance: Normal appearance. She is not ill-appearing.  HENT:     Head: Normocephalic and atraumatic.     Nose: Nose normal.  Eyes:     General: No scleral icterus.    Extraocular Movements: Extraocular movements intact.     Conjunctiva/sclera: Conjunctivae normal.  Cardiovascular:     Rate and Rhythm: Normal rate and regular rhythm.     Pulses: Normal pulses.     Heart  sounds: Normal heart sounds.  Pulmonary:     Effort: Pulmonary effort is normal. No respiratory distress.     Breath sounds: Normal breath sounds. No wheezing or rales.  Abdominal:     General: There is no distension.     Tenderness: There is no abdominal tenderness.  Musculoskeletal:        General: Normal range of motion.     Cervical back: Normal range of motion.     Comments: Cervical, thoracic spine without tenderness to palpation.  Mild tenderness palpation present over lower lumbar spinal process.  Lumbar paraspinal muscle without tenderness to palpation.  5/5 strength in bilateral lower extremities.  Sensation intact and symmetric.  Patient is able to ambulate without difficulty.  Skin:    General: Skin is warm and dry.  Neurological:      General: No focal deficit present.     Mental Status: She is alert. Mental status is at baseline.     ED Results / Procedures / Treatments   Labs (all labs ordered are listed, but only abnormal results are displayed) Labs Reviewed  URINALYSIS, ROUTINE W REFLEX MICROSCOPIC    EKG None  Radiology CT Lumbar Spine Wo Contrast  Result Date: 12/25/2021 CLINICAL DATA:  Low back pain since yesterday. EXAM: CT LUMBAR SPINE WITHOUT CONTRAST TECHNIQUE: Multidetector CT imaging of the lumbar spine was performed without intravenous contrast administration. Multiplanar CT image reconstructions were also generated. RADIATION DOSE REDUCTION: This exam was performed according to the departmental dose-optimization program which includes automated exposure control, adjustment of the mA and/or kV according to patient size and/or use of iterative reconstruction technique. COMPARISON:  CT abdomen 12/04/2019 FINDINGS: Segmentation: 5 lumbar type vertebral bodies. Alignment: Normal Vertebrae: No fracture or focal bone lesion. Paraspinal and other soft tissues: Fatty liver. Aortic atherosclerosis. Disc levels: No significant disc level finding. No disc space narrowing. No bulge or herniation. No stenosis of the canal or foramina. Mild facet osteoarthritis at L4-5 and L5-S1 which could be a cause of back pain. Mild osteoarthritis also of the sacroiliac joints. IMPRESSION: No disc pathology. No stenosis or neural compression. 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 Fatty liver. Aortic atherosclerosis. Electronically Signed   By: Paulina Fusi M.D.   On: 12/25/2021 10:54    Procedures Procedures    Medications Ordered in ED Medications  lidocaine (LIDODERM) 5 % 1 patch (1 patch Transdermal Patch Applied 12/25/21 1543)  HYDROcodone-acetaminophen (NORCO/VICODIN) 5-325 MG per tablet 1 tablet (1 tablet Oral Given 12/25/21 1541)  diazepam (VALIUM)  tablet 5 mg (5 mg Oral Given 12/25/21 1539)  ketorolac (TORADOL) 15 MG/ML injection 15 mg (15 mg Intramuscular Given 12/25/21 1546)    ED Course/ Medical Decision Making/ A&P                           Medical Decision Making Amount and/or Complexity of Data Reviewed Labs: ordered. Radiology: ordered.  Risk Prescription drug management.   Medical Decision Making / ED Course   This patient presents to the ED for concern of back pain, this involves an extensive number of treatment options, and is a complaint that carries with it a high risk of complications and morbidity.  The differential diagnosis includes cauda equina syndrome, spinal epidural abscess, muscle strain, spinous process fracture, lumbar radiculopathy  MDM: 56 year old female presents today for evaluation of mid low back pain since yesterday.  She denies trauma,  any red flag signs or symptoms concerning for cauda equina, or spinal epidural abscess.  No prior history of spinal instrumentation.  Patient given multimodal pain control with improvement in symptoms.  CT lumbar shows osteoarthritis of L4-L5, L5-S1.  No acute process noted.  Will prescribe patient lidocaine patch, naproxen, Robaxin.  We will give short course of pain medication for severe or breakthrough pain.  Discussed importance of follow-up with PCP.  Patient voices understanding and is in agreement with plan.   Lab Tests: -I ordered, reviewed, and interpreted labs.   The pertinent results include:   Labs Reviewed  URINALYSIS, ROUTINE W REFLEX MICROSCOPIC      EKG  EKG Interpretation  Date/Time:    Ventricular Rate:    PR Interval:    QRS Duration:   QT Interval:    QTC Calculation:   R Axis:     Text Interpretation:           Imaging Studies ordered: I ordered imaging studies including CT lumbar spine I independently visualized and interpreted imaging. I agree with the radiologist interpretation   Medicines ordered and prescription drug  management: Meds ordered this encounter  Medications   HYDROcodone-acetaminophen (NORCO/VICODIN) 5-325 MG per tablet 1 tablet   diazepam (VALIUM) tablet 5 mg   DISCONTD: ketorolac (TORADOL) 15 MG/ML injection 15 mg   lidocaine (LIDODERM) 5 % 1 patch   ketorolac (TORADOL) 15 MG/ML injection 15 mg    -I have reviewed the patients home medicines and have made adjustments as needed  Reevaluation: After the interventions noted above, I reevaluated the patient and found that they have :improved  Co morbidities that complicate the patient evaluation  Past Medical History:  Diagnosis Date   COPD (chronic obstructive pulmonary disease) (HCC)    Cushing's syndrome (HCC)    Diabetes mellitus without complication (HCC)    GERD (gastroesophageal reflux disease)    Hypertension    Thyroid disease       Dispostion: Patient is appropriate for discharge.  Discharged in stable condition.  Return precautions discussed.   Final Clinical Impression(s) / ED Diagnoses Final diagnoses:  Acute midline low back pain without sciatica    Rx / DC Orders ED Discharge Orders          Ordered    methocarbamol (ROBAXIN) 500 MG tablet  2 times daily        12/25/21 1710    naproxen (NAPROSYN) 375 MG tablet  2 times daily        12/25/21 1710    lidocaine (LIDODERM) 5 %  Every 24 hours        12/25/21 1710    HYDROcodone-acetaminophen (NORCO/VICODIN) 5-325 MG tablet  Every 6 hours PRN        12/25/21 1710              Marita [place], PA-C 12/25/21 1710    Benjiman Core, MD 12/26/21 [phone]

## 2021-12-26 ENCOUNTER — Other Ambulatory Visit (HOSPITAL_COMMUNITY): Payer: PRIVATE HEALTH INSURANCE | Attending: Psychiatry | Admitting: Licensed Clinical Social Worker

## 2021-12-26 DIAGNOSIS — F419 Anxiety disorder, unspecified: Secondary | ICD-10-CM | POA: Diagnosis not present

## 2021-12-26 DIAGNOSIS — F331 Major depressive disorder, recurrent, moderate: Secondary | ICD-10-CM

## 2021-12-26 DIAGNOSIS — F32A Depression, unspecified: Secondary | ICD-10-CM | POA: Diagnosis present
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
## 2021-12-27 ENCOUNTER — Other Ambulatory Visit (HOSPITAL_COMMUNITY): Payer: PRIVATE HEALTH INSURANCE | Attending: Psychiatry | Admitting: Psychiatry

## 2021-12-27 DIAGNOSIS — F331 Major depressive disorder, recurrent, moderate: Secondary | ICD-10-CM

## 2021-12-27 DIAGNOSIS — F329 Major depressive disorder, single episode, unspecified: Secondary | ICD-10-CM | POA: Diagnosis present
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
## 2021-12-28 ENCOUNTER — Other Ambulatory Visit (HOSPITAL_COMMUNITY)
Payer: PRIVATE HEALTH INSURANCE | Attending: Licensed Clinical Social Worker | Admitting: Licensed Clinical Social Worker

## 2021-12-28 DIAGNOSIS — F329 Major depressive disorder, single episode, unspecified: Secondary | ICD-10-CM | POA: Diagnosis not present

## 2021-12-28 DIAGNOSIS — F331 Major depressive disorder, recurrent, moderate: Secondary | ICD-10-CM

## 2021-12-28 NOTE — Progress Notes (Signed)
Virtual Visit via Video Note   I connected with [name] on 12/28/21 at  9:00 AM EDT by a video enabled telemedicine application and verified that I am speaking with the correct person using two identifiers.   At orientation to the IOP program, Case Manager discussed the limitations of evaluation and management by telemedicine and the availability of in person appointments. The patient expressed understanding and agreed to proceed with virtual visits throughout the duration of the program.   Location:  Patient: Patient Home Provider: [facility]   History of Present Illness: MDD   Observations/Objective: Check In: Case Manager checked in with all participants to review discharge dates, insurance authorizations, work-related documents and needs from the treatment team regarding medications. Jolleen stated needs and engaged in discussion.    Initial Therapeutic Activity: Counselor facilitated a check-in with Brice to assess for safety, sobriety and medication compliance.  Counselor also inquired about Brighid's current emotional ratings, as well as any significant changes in thoughts, feelings or behavior since previous check in.  Maeley presented for session on time and was alert, oriented x5, with no evidence or self-report of active SI/HI or A/V H.  Quincee reported compliance with medication and denied use of alcohol or illicit substances.  Hisae reported scores of 3/10 for depression, 9/10 for anxiety, and 4/10 for anger/irritability.  Siniyah denied any recent outbursts or panic attacks.  Deiondra reported that a recent struggle has been dealing with work stress, because another staff member did not complete a task, leaving her unpaid at the moment.  Khylah reported that a recent success was noticing that her back pain seems to be improving with rest, in addition to updating her resume in downtime due to ongoing issues with the current job.  Jyra reported that her goal today is to outreach her Biomedical engineer in  order to resolve the pay issue, and then attend a hair appointment.       Second Therapeutic Activity: Counselor introduced topic of anger management today.  Counselor virtually shared a handout with members on this subject featuring a variety of coping skills, and facilitated discussion on these approaches.  Examples included raising awareness of anger triggers, practicing deep breathing, keeping an anger log to better understand episodes, using diversion activities to distract oneself for 30 minutes, taking a time out when necessary, and being mindful of warning signs tied to thoughts or behavior.  Counselor inquired about which techniques group members have used before, what has proved to be helpful, what their unique warning signs might be, as well as what they will try out in the future to assist with de-escalation.  Intervention was effective, as evidenced by [name] participating in discussion on activity, and reporting that anger has been a growing problem for her recently, primarily due to feeling overwhelmed at work with excessive responsibilities, and not feeling supported by management.  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  Jamicia reported that her triggers include being treated unfairly, being ignored, facing work stress/conflict, helplessness, feeling like time is wasted, stuck in traffic jams, financial worries, high anxiety, and being let down by supports.  Denni reported that warning signs include heartrate increasing, muscles tensing, crying hysterically, headaches, sleep problems, clenching her jaw, or emotionally shutting down.  Siddalee reported that she will work to manage anger more effectively by using  coping skills such as keeping an anger journal to track outbursts and identify specific triggers, or finding a meditation routine that can help her calm  down when triggered.  Assessment and Plan: Counselor recommends that Nasiya remain in IOP treatment to better manage mental health symptoms, ensure stability and pursue completion of treatment plan goals. Counselor recommends adherence to crisis/safety plan, taking medications as prescribed, and following up with medical professionals if any issues arise.   Follow Up Instructions: Counselor will send Webex link for next session. Cristin was advised to call back or seek an in-person evaluation if the symptoms worsen or if the condition fails to improve as anticipated.   Collaboration of Care:   Medication Management AEB Dr. Karsten Ro or Hillery Jacks, NP                                          Case Manager AEB Jeri Modena, CNA    Patient/Guardian was advised Release of Information must be obtained prior to any record release in order to collaborate their care with an outside provider. Patient/Guardian was advised if they have not already done so to contact the registration department to sign all necessary forms in order for [place] to release information regarding their care.   Consent: Patient/Guardian gives verbal consent for treatment and assignment of benefits for services provided during this visit. Patient/Guardian expressed understanding and agreed to proceed.  I provided 180 minutes of non-face-to-face time during this encounter.   [name], [place], LCAS 12/28/21

## 2021-12-29 ENCOUNTER — Other Ambulatory Visit (HOSPITAL_COMMUNITY): Payer: 59 | Attending: Psychiatry | Admitting: Licensed Clinical Social Worker

## 2021-12-29 DIAGNOSIS — F332 Major depressive disorder, recurrent severe without psychotic features: Secondary | ICD-10-CM | POA: Diagnosis not present

## 2021-12-29 DIAGNOSIS — F411 Generalized anxiety disorder: Secondary | ICD-10-CM | POA: Insufficient documentation

## 2021-12-29 DIAGNOSIS — F331 Major depressive disorder, recurrent, moderate: Secondary | ICD-10-CM

## 2021-12-29 NOTE — Progress Notes (Signed)
Virtual Visit via Video Note   I connected with [name] on 12/29/21 at  9:00 AM EDT by a video enabled telemedicine application and verified that I am speaking with the correct person using two identifiers.   At orientation to the IOP program, Case Manager discussed the limitations of evaluation and management by telemedicine and the availability of in person appointments. The patient expressed understanding and agreed to proceed with virtual visits throughout the duration of the program.   Location:  Patient: Patient Home Provider: [facility]   History of Present Illness: MDD   Observations/Objective: Check In: Case Manager checked in with all participants to review discharge dates, insurance authorizations, work-related documents and needs from the treatment team regarding medications. Samari stated needs and engaged in discussion.    Initial Therapeutic Activity: Counselor facilitated a check-in with Mikhayla to assess for safety, sobriety and medication compliance.  Counselor also inquired about Maelys's current emotional ratings, as well as any significant changes in thoughts, feelings or behavior since previous check in.  Sakina presented for session on time and was alert, oriented x5, with no evidence or self-report of active SI/HI or A/V H.  Kynadie reported compliance with medication and denied use of alcohol or illicit substances.  Kirsti reported scores of 2/10 for depression, 4/10 for anxiety, and 0/10 for anger/irritability.  Elasha denied any recent outbursts or panic attacks.  Journei reported that a recent success was getting her hair done and cooking a nice dinner for self-care yesterday.  Nariah reported that she was also paid yesterday, although it wasn't the full amount.  She reported that she also slept well.  Jahnavi reported that an ongoing struggle has been dealing with back pain, although she plans to make an appointment with a specialist.  Shontel reported that her goal this weekend is to have a  family get together for the holiday, stating "We will get food and cook out probably".         Second Therapeutic Activity: Counselor covered topic of distress tolerance skills today.  Counselor utilized a DBT handout which explained how distressing situations don't always have quick solutions, so the only choice is to sit with uncomfortable emotions until they pass.  Counselor offered the IMPROVE acronym as a solution to this problem, which outlined various skills (i.e. Imagery, Meaning, Prayer, Relaxation, 'One thing in the moment', Vacation, and Encouragement) that could be explored in order to improve ability to tolerate discomfort.  Counselor tasked members with identifying personalized strategies for each category which could have been implemented to handle a recent challenge more effectively.  Intervention was effective, as evidenced by [name] actively engaging in discussion on subject, reporting that a situation she finds distressing is dealing with her job and setting boundaries while she is out on leave.  Carlie was able to identify several strategies for handling a similar struggle in the future, including using her imagination to picture herself in a beautiful relaxing place away from the job, acknowledging this challenge as an opportunity to learn more about boundaries and practice assertive communication skills, praying to her higher power for strength, talking to a supportive friend, listening to relaxing or motivating music, engaging in deep breathing, spending time outside in nature watching wildlife, petting or hugging her dog, playing mobile phone games, watching her favorite show, do some online job hunting to explore new opportunities, and visiting a family member that can distract her or lift her spirits.  Akemi stated "I need to stop worrying  about whats going on at work so much.  They'll be fine".  Third Therapeutic Activity: Counselor discussed topic of gratitude journaling with members as a  form of self-care.  Counselor virtually shared a handout with the group today which explained the benefits of this practice, including reduction in stress, increased happiness, and self-esteem.  Tips were also provided to aid in practice, such as taking time with entries, writing about people one is grateful for, and setting goal for two entries per week for at least 10-20 minutes at a time.  Counselor also provided group members with a variety of journaling prompts to choose from today, and encouraged each member to take time to write about something they are grateful for, with examples such as "Something beautiful I recently saw was..", "Something I can be proud of is.", "A reason to be excited for the future is." and more.  Members were encouraged to share their entry with the group, along with their perspective on the activity and motivation level towards making this a habit. Intervention was effective, as evidenced by [name] participating in journaling activity, and choosing the prompt "Someone I can always rely on is.".  Lakera expressed gratitude for her daughter, who has been through many challenges in her lifetime, but still makes time for Ezell no matter what may be going on.  Letoya stated "I broke both of my elbows one time and couldn't do anything for myself, but she was there for me every step of the way.  She has been my only friend at times, and I can vent and talk to her about anything".  Lesley reported that she enjoyed the activity and would consider making it regular habit.       Assessment and Plan: Counselor recommends that Ymani remain in IOP treatment to better manage mental health symptoms, ensure stability and pursue completion of treatment plan goals. Counselor recommends adherence to crisis/safety plan, taking medications as prescribed, and following up with medical professionals if any issues arise.   Follow Up Instructions: Counselor will send Webex link for next session. Manie was advised  to call back or seek an in-person evaluation if the symptoms worsen or if the condition fails to improve as anticipated.   Collaboration of Care:   Medication Management AEB Dr. Karsten Ro or Hillery Jacks, NP                                          Case Manager AEB Jeri Modena, CNA    Patient/Guardian was advised Release of Information must be obtained prior to any record release in order to collaborate their care with an outside provider. Patient/Guardian was advised if they have not already done so to contact the registration department to sign all necessary forms in order for [place] to release information regarding their care.   Consent: Patient/Guardian gives verbal consent for treatment and assignment of benefits for services provided during this visit. Patient/Guardian expressed understanding and agreed to proceed.  I provided 145 minutes of non-face-to-face time during this encounter.   [name], LCSW, LCAS 12/29/21

## 2022-01-02 ENCOUNTER — Other Ambulatory Visit (HOSPITAL_COMMUNITY): Payer: 59 | Attending: Psychiatry | Admitting: Licensed Clinical Social Worker

## 2022-01-02 DIAGNOSIS — F331 Major depressive disorder, recurrent, moderate: Secondary | ICD-10-CM | POA: Diagnosis present

## 2022-01-02 NOTE — Progress Notes (Signed)
Virtual Visit via Video Note   I connected with [name] on 01/02/22 at  9:00 AM EDT by a video enabled telemedicine application and verified that I am speaking with the correct person using two identifiers.   At orientation to the IOP program, Case Manager discussed the limitations of evaluation and management by telemedicine and the availability of in person appointments. The patient expressed understanding and agreed to proceed with virtual visits throughout the duration of the program.   Location:  Patient: Patient Home Provider: [facility]   History of Present Illness: MDD   Observations/Objective: Check In: Case Manager checked in with all participants to review discharge dates, insurance authorizations, work-related documents and needs from the treatment team regarding medications. Barry stated needs and engaged in discussion.    Initial Therapeutic Activity: Counselor facilitated a check-in with Sarea to assess for safety, sobriety and medication compliance.  Counselor also inquired about Mansi's current emotional ratings, as well as any significant changes in thoughts, feelings or behavior since previous check in.  Sharea presented for session on time and was alert, oriented x5, with no evidence or self-report of active SI/HI or A/V H.  Dominica reported compliance with medication and denied use of alcohol or illicit substances.  Malvika reported scores of 7/10 for depression, 9/10 for anxiety, and 0/10 for anger/irritability.  Natalin denied any recent outbursts or panic attacks.  Caitrin reported that a recent success was continuing her job hunt in order to find a less stressful position to transition to for work.  Priyal reported that a recent struggle was "Having a rough weekend" and missing the support of group yesterday while out for Labor Day.  Sheryn reported that her back has still been hurting most days and she had some relationship issues as well, stating "I took some things he said the wrong way".   Dewanna reported that her goal today is to attend a doctor's appointment this afternoon, which she is anxious about, but stated "It won't do me any good to think the worst about it".         Second Therapeutic Activity: Counselor continued discussion upon topic of self-care today with group members.  Counselor virtually shared self-care assessment form with members via Webex to complete final sub-categories (i.e. social, spiritual, and professional).  Members were asked to rank their engagement in the activities listed for each dimension on a scale of 1-3, with 1 indicating 'Poor', 2 indicating 'Ok', and 3 indicating 'Well'.  Counselor invited members to share results of this assessment, and inquired about which areas of self-care they are doing well in, as well as areas that require attention, and how they plan to begin addressing this during treatment.  Intervention was effective, as evidenced by [name] successfully completing final sections of assessment and actively engaging in discussion on subject, reporting that she is excelling in areas such as spending time alone with her partner, appreciating meaningful art, acting in accordance with morals and values, spending time in nature, and keeping a comfortable workspace, but would benefit from focusing more on areas such as calling or writing to friends or family that live far away, asking others for help when needed, keeping in touch with old friends, doing enjoyable activities with other people,  praying and meditating more often, taking time for thought and reflection, participating in a cause that is important to her,  learning to say "No" to excessive responsibilities, taking breaks from work, and maintaining a healthier work life balance.  Desirea reported that she would work to improve self-care deficits by outreaching family that live out of the area more frequently to catch up, practicing humility to avoid turning down help when facing a crisis, taking  advantage of opportunities to spend time with old friends while out of work, Network engineer plans with supports to do more fun things out of the home like going for hikes as the weather cools down, getting involved in an organization that helps domestic violence victims, structuring her routine to include more time for meditation, and working on assertive communication skills in order to establish healthier boundaries at her job.    Assessment and Plan: Counselor recommends that Tazaria remain in IOP treatment to better manage mental health symptoms, ensure stability and pursue completion of treatment plan goals. Counselor recommends adherence to crisis/safety plan, taking medications as prescribed, and following up with medical professionals if any issues arise.   Follow Up Instructions: Counselor will send Webex link for next session. Inell was advised to call back or seek an in-person evaluation if the symptoms worsen or if the condition fails to improve as anticipated.   Collaboration of Care:   Medication Management AEB Dr. Karsten Ro or Hillery Jacks, NP                                          Case Manager AEB Jeri Modena, CNA    Patient/Guardian was advised Release of Information must be obtained prior to any record release in order to collaborate their care with an outside provider. Patient/Guardian was advised if they have not already done so to contact the registration department to sign all necessary forms in order for [place] to release information regarding their care.   Consent: Patient/Guardian gives verbal consent for treatment and assignment of benefits for services provided during this visit. Patient/Guardian expressed understanding and agreed to proceed.  I provided 165 minutes of non-face-to-face time during this encounter.   [name], LCSW, LCAS 01/02/22

## 2022-01-03 ENCOUNTER — Other Ambulatory Visit (HOSPITAL_COMMUNITY): Payer: 59 | Attending: Psychiatry | Admitting: Psychiatry

## 2022-01-03 DIAGNOSIS — F331 Major depressive disorder, recurrent, moderate: Secondary | ICD-10-CM

## 2022-01-03 DIAGNOSIS — F329 Major depressive disorder, single episode, unspecified: Secondary | ICD-10-CM | POA: Insufficient documentation

## 2022-01-03 NOTE — Progress Notes (Signed)
Virtual Visit via Video Note   I connected with Katherine Blair on 01/03/22 at  9:00 AM EDT by a video enabled telemedicine application and verified that I am speaking with the correct person using two identifiers.   At orientation to the IOP program, Case Manager discussed the limitations of evaluation and management by telemedicine and the availability of in person appointments. The patient expressed understanding and agreed to proceed with virtual visits throughout the duration of the program.   Location:  Patient: Patient Home Provider: [facility]   History of Present Illness: MDD   Observations/Objective: Check In: Case Manager checked in with all participants to review discharge dates, insurance authorizations, work-related documents and needs from the treatment team regarding medications. Katherine Blair stated needs and engaged in discussion.    Initial Therapeutic Activity: Counselor facilitated a check-in with Katherine Blair to assess for safety, sobriety and medication compliance.  Counselor also inquired about Katherine Blair's current emotional ratings, as well as any significant changes in thoughts, feelings or behavior since previous check in.  Katherine Blair presented for session on time and was alert, oriented x5, with no evidence or self-report of active SI/HI or A/V H.  Katherine Blair reported compliance with medication and denied use of alcohol or illicit substances.  Katherine Blair reported scores of 3/10 for depression, 4/10 for anxiety, and 0/10 for anger/irritability.  Katherine Blair denied any recent outbursts or panic attacks.  Katherine Blair reported that a recent success was attending a doctor's appointment yesterday and receiving good news, stating "That was a lot of weight off my shoulders".  Katherine Blair reported that a recent struggle was learning that insurance at her company had been temporarily deactivated, although this was resolved with a phone call.  Katherine Blair reported that her goal today is to attend a job interview this afternoon, which she is optimistic  about.       Second Therapeutic Activity: Counselor introduced Katherine Blair, [employer] Pharmacist, to provide psychoeducation on topic of medication compliance with members today.  Katherine Blair provided psychoeducation on classes of medications such as antidepressants, antipsychotics, what symptoms they are intended to treat, and any side effects one might encounter while on a particular prescription.  Time was allowed for clients to ask any questions they might have of [facility] regarding this specialty.  Intervention was effective, as evidenced by Katherine Blair participating in discussion with speaker on the subject, reporting that she started Sertraline a few weeks ago, and was wondering how long it might take until she begins to notice symptom relief.  Katherine Blair was receptive to clarity from pharmacist on how long it typically takes for medications like this to cause noticeable changes in functioning and relief.    Third Therapeutic Activity: Counselor provided psychoeducation on subject of boundaries with group members today using a virtual handout.  This handout defined boundaries as the limits and rules that we set for ourselves within relationships, and featured a breakdown of the 3 common categories of boundaries (i.e. porous, rigid, and healthy), along with typical traits specific to each one for easy identification.  It was noted that most people have a mixture of different boundary types depending on setting, person, and culture.  Additional information was provided on the types of boundaries (i.e. physical, intellectual, emotional, sexual, material, and time) within relationships, and what could be considered healthy versus unhealthy. Counselor tasked members with identifying what types of boundaries they presently hold within her own support systems, the collective impact these boundaries have upon their mental health, and changes that could be made  in order to more effectively communicate individual mental health needs.   Intervention was effective, as evidenced by Katherine Blair actively engaging in discussion on topic, reporting that she has had very porous boundaries for several years with her job, due to traits such as having trouble saying "No" to unreasonable demands, becoming overinvolved in other peoples problems, dependent on the opinions of others, accepting of abuse and disrespect, as well as fearing rejection if she doesn't comply with others requests.  Katherine Blair reported that outside of work, she has fairly rigid boundaries, and will be protective of personal information and keep a small circle of support which she rarely asks for help from.  Katherine Blair reported that she plans to pursue a healthy medium in regard to current boundaries by becoming more assertive so that she can address ongoing problems at her job with being overworked, stand up for herself, and be more willing to ask for help when facing overwhelming stress.    Assessment and Plan: Counselor recommends that Katherine Blair remain in IOP treatment to better manage mental health symptoms, ensure stability and pursue completion of treatment plan goals. Counselor recommends adherence to crisis/safety plan, taking medications as prescribed, and following up with medical professionals if any issues arise.   Follow Up Instructions: Counselor will send Webex link for next session. Katherine Blair was advised to call back or seek an in-person evaluation if the symptoms worsen or if the condition fails to improve as anticipated.   Collaboration of Care:   Medication Management AEB Dr. Karsten Ro or Hillery Jacks, NP                                          Case Manager AEB Jeri Modena, CNA    Patient/Guardian was advised Release of Information must be obtained prior to any record release in order to collaborate their care with an outside provider. Patient/Guardian was advised if they have not already done so to contact the registration department to sign all necessary forms in order for [place] to  release information regarding their care.   Consent: Patient/Guardian gives verbal consent for treatment and assignment of benefits for services provided during this visit. Patient/Guardian expressed understanding and agreed to proceed.  I provided 180 minutes of non-face-to-face time during this encounter.   [name], LCSW, LCAS 01/03/22

## 2022-01-04 NOTE — Patient Instructions (Addendum)
Update..On 01-05-22 @ 0900, pt is requesting to continue in MH-IOP.  Pt will discharge one day next week. D:  Patient is scheduled for discharge on 01-05-22.  A:  Follow up with Dr. Mercy Riding on 01-10-22 @ 3pm and a therapist of her choice.  Patient can reach out to her insurance company for names.  Encouraged support groups through [facility] [phone].  R:  Patient receptive.

## 2022-01-05 ENCOUNTER — Other Ambulatory Visit (HOSPITAL_COMMUNITY): Payer: 59 | Attending: Psychiatry | Admitting: Licensed Clinical Social Worker

## 2022-01-05 DIAGNOSIS — F331 Major depressive disorder, recurrent, moderate: Secondary | ICD-10-CM | POA: Insufficient documentation

## 2022-01-05 NOTE — Progress Notes (Signed)
Virtual Visit via Video Note   I connected with Katherine Blair on 01/05/22 at  9:00 AM EDT by a video enabled telemedicine application and verified that I am speaking with the correct person using two identifiers.   At orientation to the IOP program, Case Manager discussed the limitations of evaluation and management by telemedicine and the availability of in person appointments. The patient expressed understanding and agreed to proceed with virtual visits throughout the duration of the program.   Location:  Patient: Patient Home Provider: [facility]   History of Present Illness: MDD   Observations/Objective: Check In: Case Manager checked in with all participants to review discharge dates, insurance authorizations, work-related documents and needs from the treatment team regarding medications. Katherine Blair stated needs and engaged in discussion.    Initial Therapeutic Activity: Counselor facilitated a check-in with Katherine Blair to assess for safety, sobriety and medication compliance.  Counselor also inquired about Katherine Blair's current emotional ratings, as well as any significant changes in thoughts, feelings or behavior since previous check in.  Katherine Blair presented for session on time and was alert, oriented x5, with no evidence or self-report of active SI/HI or A/V H.  Katherine Blair reported compliance with medication and denied use of alcohol or illicit substances.  Katherine Blair reported scores of 3/10 for depression, 4/10 for anxiety, and 0/10 for anger/irritability.  Katherine Blair denied any recent outbursts or panic attacks.  Katherine Blair reported that a recent success was managing boundaries more effectively with her current job, stating "I did ignore some calls".  Katherine Blair reported that a recent struggle was having her interview for a new job cancelled, stating "That caused a little anxiety for me".  Katherine Blair reported that she has continued to apply to new positions and has not lost hope.  Katherine Blair reported that her goal this weekend is to work on cleaning out her  camper so that it can be put up for sale.        Second Therapeutic Activity: Counselor introduced topic of grounding skills today.  Counselor defined these as simple strategies one can use to help detach from difficult thoughts or feelings temporarily by focusing on something else.  Counselor noted that grounding will not solve the problem at hand, but can provide the practitioner with time to regain control over their thoughts and/or feelings and prevent the situation from getting worse (i.e. interrupting a panic attack).  Counselor divided these into three categories (mental, physical, and soothing) and then provided examples of each which group members could practice during session.  Some of these included describing one's environment in detail or playing a categories game with oneself for mental category, taking a hot bath/shower, stretching, or carrying a grounding object for physical category, and saying kind statements, or visualizing people one cares about for soothing category.  Counselor inquired about which techniques members have used with success in the past, or will commit to learning, practicing, and applying now to improve coping abilities.  Intervention was effective, as evidenced by Katherine Blair participating in discussion on the subject, trying out several of the techniques during session, and expressing interest in adding several to her available coping skills, such as describing her environment in detail, describing an everyday activity in great detail, imagining herself at the beach relaxing, watching a comedy or funny video that can make her laugh, counting to 10, splashing cold water on her face, clicking a pen open and closed repetitively, using her ring as a grounding object, using deep breathing, saying kind statements to herself  like "Take it one step at a time", "I have made great improvement", and "I am enough".    Assessment and Plan: Counselor recommends that Katherine Blair remain in IOP  treatment to better manage mental health symptoms, ensure stability and pursue completion of treatment plan goals. Counselor recommends adherence to crisis/safety plan, taking medications as prescribed, and following up with medical professionals if any issues arise.   Follow Up Instructions: Counselor will send Webex link for next session. Katherine Blair was advised to call back or seek an in-person evaluation if the symptoms worsen or if the condition fails to improve as anticipated.   Collaboration of Care:   Medication Management AEB Dr. Karsten Ro or Hillery Jacks, NP                                          Case Manager AEB Jeri Modena, CNA    Patient/Guardian was advised Release of Information must be obtained prior to any record release in order to collaborate their care with an outside provider. Patient/Guardian was advised if they have not already done so to contact the registration department to sign all necessary forms in order for [place] to release information regarding their care.   Consent: Patient/Guardian gives verbal consent for treatment and assignment of benefits for services provided during this visit. Patient/Guardian expressed understanding and agreed to proceed.  I provided 180 minutes of non-face-to-face time during this encounter.   [name], LCSW, LCAS 01/05/22

## 2022-01-08 ENCOUNTER — Other Ambulatory Visit (HOSPITAL_COMMUNITY): Payer: 59 | Attending: Psychiatry | Admitting: Professional

## 2022-01-08 DIAGNOSIS — F331 Major depressive disorder, recurrent, moderate: Secondary | ICD-10-CM | POA: Diagnosis present

## 2022-01-09 ENCOUNTER — Other Ambulatory Visit (HOSPITAL_COMMUNITY): Payer: 59 | Attending: Psychiatry | Admitting: Licensed Clinical Social Worker

## 2022-01-09 DIAGNOSIS — F331 Major depressive disorder, recurrent, moderate: Secondary | ICD-10-CM | POA: Insufficient documentation

## 2022-01-09 NOTE — Progress Notes (Signed)
  [facility] Intensive Outpatient Program Discharge Summary  Katherine Blair [id number] Virtual Visit via Telephone Note  I connected with Katherine Blair on 01/09/22 at  9:00 AM EDT by telephone and verified that I am speaking with the correct person using two identifiers.  Location: Patient:Home Provider: Clinic   I discussed the limitations, risks, security and privacy concerns of performing an evaluation and management service by telephone and the availability of in person appointments. I also discussed with the patient that there may be a patient responsible charge related to this service. The patient expressed understanding and agreed to proceed.   I discussed the assessment and treatment plan with the patient. The patient was provided an opportunity to ask questions and all were answered. The patient agreed with the plan and demonstrated an understanding of the instructions.   The patient was advised to call back or seek an in-person evaluation if the symptoms worsen or if the condition fails to improve as anticipated.  I provided 10 minutes of non-face-to-face time during this encounter.   Katherine Ro, MD  Admission date: 12/18/21 Discharge date: 01/09/22  Reason for admission: Worsening depression and anxiety  Chemical Use History: Reports a history of substance abuse with alcohol.   Family of Origin Issues: Per chart, bipolar-father Drug use-sister  Progress in Program Toward Treatment Goals: Progressing  Progress (rationale): Pt reports that her mood is " good". She reports improvement in symptoms of depression and anxiety.  She reports some anxiety as she is going to start her work again.  She has been sleeping and eating well.  Currently, she denies any suicidal ideations, homicidal ideations, auditory and visual hallucinations.  She denies paranoia.  She denies any medication side effects and has been tolerating it well.  She reports that she benefited from IOP  program and developed coping skills.  Patient denies any need for change in medication and medication dosages and wants to continue same meds.  She denies any other concerns.  Patient reports that she has follow-up tomorrow at 3 PM with Dr. Everlena Blair at [facility].  Patient is alert and oriented x 4,  calm, cooperative, and fully engaged in conversation during the encounter.  Her thought process is linear with coherent speech .    Patient completed IOP program on 01/09/2022.   Collaboration of Care: Medication Management AEB   and Other IOP Team  Patient/Guardian was advised Release of Information must be obtained prior to any record release in order to collaborate their care with an outside provider. Patient/Guardian was advised if they have not already done so to contact the registration department to sign all necessary forms in order for [place] to release information regarding their care.   Consent: Patient/Guardian gives verbal consent for treatment and assignment of benefits for services provided during this visit. Patient/Guardian expressed understanding and agreed to proceed.   Katherine Ro, MD 01/09/2022

## 2022-01-09 NOTE — Progress Notes (Signed)
  Virtual Visit via Telephone Note  I connected with [name] on @TODAY @ at  9:00 AM EDT by telephone and verified that I am speaking with the correct person using two identifiers.  Location: Patient: at home Provider: at office   I discussed the limitations, risks, security and privacy concerns of performing an evaluation and management service by telephone and the availability of in person appointments. I also discussed with the patient that there may be a patient responsible charge related to this service. The patient expressed understanding and agreed to proceed.   I discussed the assessment and treatment plan with the patient. The patient was provided an opportunity to ask questions and all were answered. The patient agreed with the plan and demonstrated an understanding of the instructions.   The patient was advised to call back or seek an in-person evaluation if the symptoms worsen or if the condition fails to improve as anticipated.  I provided 30 minutes of non-face-to-face time during this encounter.   , M.Ed,CNA  Patient ID: Maddilynn Esperanza, female   DOB: 11-02-1965, 56 y.o.   MRN: [id number] As per previous CCA states:  This is a 56 yr old, widowed, Caucasian female who was referred per [facility]; treatment for worsening anxiety symptoms.  States anxiety started to worsen a couple of months ago.  Stressors/Triggers:  1) Medical Issues:  PCP was running a lot of tests; resulted in her being dx'd with an enlarged liver.  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  My ex-husband died of this and I just had a melt down b/c I didn't know how I was going to tell my kids."  2) Job ([employer]) since 2019.  Pt is a 2020.  "We have no leadership.  I work 12-14 hrs a day."  Pt admits to one prior psychiatric admit at age 56 d/t a suicide attempt(cut L-Wrist)/drugs & ETOH.  Family hx:  Father (Bipolar) and Sister (Addict).  Pt attended all scheduled  days in MH-IOP.  Reports overall feeling much better.  States she is anxious about returning to work.  Pt is still exploring other job options.  Pt is able to apply the skills she has learned.  Pt is setting boundaries better and practicing self-care.  Denies SI/HI or A/V hallucinations.  A:  D/C today.  F/U with Dr. 30 tomorrow at 3pm (in person). Pt was advised of ROI must be obtained prior to any records release in order to collaborate her care with an outside provider.  Pt was advised if she has not already done so to contact the front desk to sign all necessary forms in order for MH-IOP to release info re: her care. Consent:  Pt gives verbal consent for tx and assignment of benefits for services provided during this telehealth group process.  Pt expressed understanding and agreed to proceed. Collaboration of care:  Collaborate with Dr. Mercy Riding AEB, Dr. Dewayne Shorter, and Alan Mulder, LCSW AEB.  Strongly encouraged support groups.  Encouraged pt to contact her insurance company for approved therapists.  Pt is wanting a therapist who is practicing remotely. RTW on 01-15-22 without any restrictions.  R:  Patient receptive.  02-27-1992, M.Ed,CNA

## 2022-01-09 NOTE — Progress Notes (Signed)
Virtual Visit via Video Note  I connected with Katherine Blair on 01/09/22 at  9:00 AM EDT by a video enabled telemedicine application and verified that I am speaking with the correct person using two identifiers.  Location: Patient: pt's home Provider: clinical office   I discussed the limitations of evaluation and management by telemedicine and the availability of in person appointments. The patient expressed understanding and agreed to proceed.   I discussed the assessment and treatment plan with the patient. The patient was provided an opportunity to ask questions and all were answered. The patient agreed with the plan and demonstrated an understanding of the instructions.   The patient was advised to call back or seek an in-person evaluation if the symptoms worsen or if the condition fails to improve as anticipated.  I provided 120 minutes of non-face-to-face time during this encounter.   Wyvonnia Lora, LCSWA    Daily Group Progress Note  Program: IOP   Session Time: 9:00 am - 10:00 am  Participation Level: Active  Behavioral Response: CasualAlertEuthymic  Type of Therapy: Group Therapy  Treatment Goals addressed: Coping  Progress Towards Goals: Progressing  Interventions: CBT, DBT, Solution Focused, Strength-based, Supportive, and Reframing  Therapist Response: Clinician led check-in regarding current stressors and situation, and review of patient completed daily inventory. Clinician utilized active listening and empathetic response and validated patient emotions. Clinician facilitated processing group on pertinent issues.?Counselor introduced [place], [place] Chaplain to provide psychoeducation on topic of Grief and Loss with members today.  Marchelle Folks began discussion by checking in with the group about their baseline mood today, general thoughts on what grief means to them and how it has affected them personally in the past.  Marchelle Folks provided information on how the  process of grief/loss can differ depending upon one's unique culture, and categories of loss one could experience (i.e. loss of a person, animal, relationship, job, identity, etc).  Marchelle Folks encouraged members to be mindful of how pervasive loss can be, and how to recognize signs which could indicate that this is having an impact on one's overall mental health and wellbeing.   Summary: Katherine Blair is a 56yo female who presents with anxiety and depression symptoms. Patient arrived within time allowed. When asked by chaplain what has recently brought her joy, pt reports that progressing in treatment and being productive have brought her joy. Pt able to process.?Pt engaged in discussion.?      Session Time: 10:00 am - 11:00 am  Participation Level: Active  Behavioral Response: CasualAlertEuthymic  Type of Therapy: Group Therapy  Treatment Goals addressed: Coping  Progress Towards Goals: Progressing  Interventions: CBT, DBT, Solution Focused, Strength-based, Supportive, and Reframing  Therapist Response: Clinician led group on coping with strong emotions and navigating chronic SI. Clinician utilized CBT principles to inform discussion.   Summary: Pt engaged in discussion. Pt emphasized the importance of leaning on supports rather than focusing on those that are not supportive. Pt verbalized support and encouragement toward peers.   Session Time: 11:00 am - 12:00 pm  Participation Level: Did Not Attend- Pt did not return after break.    Diagnosis: MDD (major depressive disorder), recurrent episode, moderate (HCC)  Collaboration of Care: Medication Management AEB Dr. Leone Haven  Patient/Guardian was advised Release of Information must be obtained prior to any record release in order to collaborate their care with an outside provider. Patient/Guardian was advised if they have not already done so to contact the registration department to sign all necessary forms  in order for [place] to release  information regarding their care.   Consent: Patient/Guardian gives verbal consent for treatment and assignment of benefits for services provided during this visit. Patient/Guardian expressed understanding and agreed to proceed.    Chestine Spore, RITA

## 2022-01-10 ENCOUNTER — Ambulatory Visit (HOSPITAL_COMMUNITY): Payer: PRIVATE HEALTH INSURANCE | Admitting: Psychiatry

## 2022-01-10 ENCOUNTER — Encounter (HOSPITAL_COMMUNITY): Payer: Self-pay | Admitting: Psychiatry

## 2022-01-10 DIAGNOSIS — F331 Major depressive disorder, recurrent, moderate: Secondary | ICD-10-CM | POA: Diagnosis not present

## 2022-01-10 MED ORDER — SERTRALINE HCL 50 MG PO TABS: 75.0000 mg | ORAL_TABLET | Freq: Every day | ORAL | 1 refills | Status: DC

## 2022-01-10 NOTE — Progress Notes (Addendum)
Psychiatric Initial Adult Assessment   Patient Identification: Katherine Blair MRN:  [id number] Date of Evaluation:  01/10/2022 Referral Source: IOP Chief Complaint:   Chief Complaint  Patient presents with   Establish Care   Visit Diagnosis:    ICD-10-CM   1. MDD (major depressive disorder), recurrent episode, moderate (HCC)  F33.1        Assessment:  Katherine Blair is a 56 y.o. y.o. female with a history of MDD who presents in person to [facility] at [facility] for initial evaluation of depression and anxiety as well as establishment of care.  Patient completed IOP on 01/09/22 and records were reviewed  Patient reports symptoms of depressed mood, anxiety, racing thoughts, tearfulness, and feelings of being overwhelmed.  The symptoms have improved over the past few weeks while she was attending the partial program.  Following her completion of the PHP program patient notes a slight increase in her anxiety and racing thoughts.  She denies any SI/HI or thoughts of self-harm along with any manic symptoms.  Patient notes good initial response from the medication and denies adverse side effects at this time.  We will increase Zoloft to 75 mg daily and we also discussed the importance of using coping strategies, incorporating structure into her day, and setting boundaries with work going forward.  Patient plans and work on this and notes having good supports in her children, kids, and her significant other.  Plan: - TSH WNL - Increase Zoloft to 75 mg - Patient looking for a therapist - Follow up in a month    History of Present Illness:  Patient presents reporting that her anxiety started to worsen a couple of months ago in the context of multiple stressors.  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 She is currently on FMLA and will return on Monday 9/18.  Patient reports that her mood improved over the course of IOP and while she still has some anxiety and depression it is much more manageable compared to when she began the program.  She notes that during the program she was able to take some time for herself and to relax which she has not really been able to do in the past.  This alongside though working to meditate and unwind have become her coping mechanisms that she plans to use going forward.  Katherine Blair notes that she did have a little bit of increased anxiety since finishing the program and thinking about returning to work.  She feels that there is work that has just accumulated for her on her return and we talked about the importance of being gentle with herself and setting boundaries on how she will accomplish that.  Katherine Blair has found the medication to be somewhat helpful but still feels like there is room for improvement.  We talked about increasing the medication and discussed the risk and benefits.  Associated Signs/Symptoms: Depression Symptoms:  fatigue, anxiety, (Hypo) Manic Symptoms:   Denies Anxiety Symptoms:  Excessive Worry, Psychotic Symptoms:   Denies PTSD Symptoms: NA  Past Psychiatric History: Patient recently completed an IOP admission from 12/18/21-01/09/22 for worsening depression and anxiety. Patient also had one prior inpatinet pscyhiatric  admission when she was 25 following a suicide attempt to cut her wrists while using substances and alcohol.  She reports a history of substance abuse with alcohol.   Previous Psychotropic Medications: Yes   Substance Abuse History in the last 12 months:  No.  Consequences of Substance Abuse: NA  Past Medical History:  Past Medical History:  Diagnosis Date   COPD (chronic obstructive pulmonary disease) (HCC)     Cushing's syndrome (HCC)    Diabetes mellitus without complication (HCC)    GERD (gastroesophageal reflux disease)    Hypertension    Thyroid disease     Past Surgical History:  Procedure Laterality Date   BLADDER SURGERY     CESAREAN SECTION     DILATION AND CURETTAGE OF UTERUS     PARTIAL HYSTERECTOMY     TUBAL LIGATION      Family Psychiatric History: bipolar-father, Drug use-sister  Family History:  Family History  Problem Relation Age of Onset   Diabetes Mother    Bipolar disorder Father    Diabetes Father    Drug abuse Sister    Diabetes Sister     Social History:   Social History   Socioeconomic History   Marital status: Single    Spouse name: Not on file   Number of children: Not on file   Years of education: Not on file   Highest education level: Not on file  Occupational History   Not on file  Tobacco Use   Smoking status: Every Day    Packs/day: 1.00    Years: 30.00    Total pack years: 30.00    Types: Cigarettes   Smokeless tobacco: Never  Vaping Use   Vaping Use: Never used  Substance and Sexual Activity   Alcohol use: Not Currently    Comment: social   Drug use: No   Sexual activity: Not on file  Other Topics Concern   Not on file  Social History Narrative   Not on file   Social Determinants of Health   Financial Resource Strain: Not on file  Food Insecurity: Not on file  Transportation Needs: Not on file  Physical Activity: Not on file  Stress: Not on file  Social Connections: Not on file    Additional Social History: 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  She often has to travel for work and live in hotels.  Patient lives with her partner and has a couple kids as well as 3 grandkids who she gets to see regularly.  Allergies:   Allergies  Allergen Reactions   Penicillins Rash    Metabolic Disorder Labs: Lab Results  Component Value Date   HGBA1C 6.7 (A)  09/24/2019   No results found for: "PROLACTIN" No results found for: "CHOL", "TRIG", "HDL", "CHOLHDL", "VLDL", "LDLCALC" Lab Results  Component Value Date   TSH 2.45 03/31/2019    Therapeutic Level Labs: No results found for: "LITHIUM" No results found for: "CBMZ" No results found for: "VALPROATE"  Current Medications: Current Outpatient Medications  Medication Sig Dispense Refill   atorvastatin (LIPITOR) 20 MG tablet Take 20 mg by mouth at bedtime.     dicyclomine (BENTYL) 20 MG tablet Take 1 tablet (20 mg total) by mouth 4 (four) times daily as needed for spasms. 20 tablet 0   Dulaglutide (TRULICITY) 4.5 MG/0.5ML SOPN Inject 4.5 mg into the skin once a week. 6 mL 3  GABAPENTIN PO Take by mouth.     HYDROcodone-acetaminophen (NORCO/VICODIN) 5-325 MG tablet Take 2 tablets by mouth every 6 (six) hours as needed for severe pain. 8 tablet 0   insulin glargine, 2 Unit Dial, (TOUJEO MAX SOLOSTAR) 300 UNIT/ML Solostar Pen Inject 110 Units into the skin every morning. 42 mL 3   Insulin Pen Needle (PEN NEEDLES) 32G X 5 MM MISC 1 Device by Does not apply route daily. 90 each 3   lidocaine (LIDODERM) 5 % Place 1 patch onto the skin daily. Remove & Discard patch within 12 hours or as directed by MD 14 patch 0   methocarbamol (ROBAXIN) 500 MG tablet Take 1 tablet (500 mg total) by mouth 2 (two) times daily. 20 tablet 0   naproxen (NAPROSYN) 375 MG tablet Take 1 tablet (375 mg total) by mouth 2 (two) times daily. 20 tablet 0   omeprazole (PRILOSEC) 20 MG capsule Take 20 mg by mouth daily.     ondansetron (ZOFRAN ODT) 8 MG disintegrating tablet Take 1 tablet (8 mg total) by mouth every 8 (eight) hours as needed for nausea or vomiting. 10 tablet 1   PHENTERMINE HCL PO Take by mouth.     sertraline (ZOLOFT) 50 MG tablet Take 1.5 tablets (75 mg total) by mouth daily. 45 tablet 1   No current facility-administered medications for this visit.    Musculoskeletal: Strength & Muscle Tone: within  normal limits Gait & Station: normal Patient leans: N/A  Psychiatric Specialty Exam: Review of Systems  There were no vitals taken for this visit.There is no height or weight on file to calculate BMI.  General Appearance: Well Groomed  Eye Contact:  Good  Speech:  Normal Rate  Volume:  Normal  Mood:  Anxious  Affect:  Congruent  Thought Process:  Coherent  Orientation:  Full (Time, Place, and Person)  Thought Content:  Logical  Suicidal Thoughts:  No  Homicidal Thoughts:  No  Memory:  NA  Judgement:  Good  Insight:  Fair  Psychomotor Activity:  Normal  Concentration:  Concentration: NA  Recall:  Good  Fund of Knowledge:Good  Language: Good  Akathisia:  NA  Handed:  Right  AIMS (if indicated):  not done  Assets:  Communication Skills Desire for Improvement Financial Resources/Insurance Housing  ADL's:  Intact  Cognition: WNL  Sleep:  Good   Screenings: PHQ2-9    Flowsheet Row Counselor from 12/14/2021 in BEHAVIORAL HEALTH INTENSIVE PSYCH  PHQ-2 Total Score 5  PHQ-9 Total Score 17      Flowsheet Row ED from 12/25/2021 in [facility] EMERGENCY DEPARTMENT Counselor from 12/14/2021 in BEHAVIORAL HEALTH INTENSIVE [facility] ED from [facility]13/2021 in [facility]  C-SSRS RISK CATEGORY No Risk Error: Question 6 not populated No Risk        Collaboration of Care: Medication Management AEB medication prescription  Patient/Guardian was advised Release of Information must be obtained prior to any record release in order to collaborate their care with an outside provider. Patient/Guardian was advised if they have not already done so to contact the registration department to sign all necessary forms in order for [place] to release information regarding their care.   Consent: Patient/Guardian gives verbal consent for treatment and assignment of benefits for services provided during this visit. Patient/Guardian expressed understanding and agreed to  proceed.   Stasia Cavalier, MD 9/13/20234:22 PM

## 2022-01-11 NOTE — Progress Notes (Signed)
Virtual Visit via Video Note   I connected with [name] on 01/08/22 at  9:00 AM EDT by a video enabled telemedicine application and verified that I am speaking with the correct person using two identifiers.   At orientation to the IOP program, Case Manager discussed the limitations of evaluation and management by telemedicine and the availability of in person appointments. The patient expressed understanding and agreed to proceed with virtual visits throughout the duration of the program.   Location:  Patient: Patient Home Provider: Clinical Home Office   History of Present Illness: MDD   Observations/Objective: Check In: Case Manager checked in with all participants to review discharge dates, insurance authorizations, work-related documents and needs from the treatment team regarding medications. Lyanna stated needs and engaged in discussion.    Initial Therapeutic Activity: Counselor facilitated a check-in with Angeleen to assess for safety, sobriety and medication compliance.  Counselor also inquired about Mandisa's current emotional ratings, as well as any significant changes in thoughts, feelings or behavior since previous check in.  Masiyah presented for session on time and was alert, oriented x5, with no evidence or self-report of active SI/HI or A/V H.  Mitzi reported compliance with medication and denied use of alcohol or illicit substances.  Lida rates herself at an 8 on a scale of 1-very poor and 10-excellent. Jezabel denied any recent outbursts or panic attacks.  Tailer reported that a recent success was "feeling the best I have felt in a long time" and cleaned her house.  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 Rielynn reported that she continues to try to set boundaries.  Brianne reported that her goal for today is to take a walk.        Second Therapeutic Activity: Clinician introduced topic of "Positive Psychology". Group  watched "Positive Psychology" Ted-Talk. Patients discussed how their "lens" of life affects the way they feel. Group discussed 5 strategies to help change lens. Patients identified one strategy they would be willing to try to change their "lens" for at least 21 days to create a new habit. Intervention was effective, as evidenced by [name] participating in discussion on the subject, and identifying areas of the TedTalk she relates to, including moving goal posts. Dalary reports she will try meditation in the mornings to help change her lens.   Assessment and Plan: Counselor recommends that Shelby remain in IOP treatment to better manage mental health symptoms, ensure stability and pursue completion of treatment plan goals. Counselor recommends adherence to crisis/safety plan, taking medications as prescribed, and following up with medical professionals if any issues arise.   Follow Up Instructions: Counselor will send Webex link for next session. Kerensa was advised to call back or seek an in-person evaluation if the symptoms worsen or if the condition fails to improve as anticipated.   Collaboration of Care:   Medication Management AEB Dr. Karsten Ro or Hillery Jacks, NP                                          Case Manager AEB Jeri Modena, CNA    Patient/Guardian was advised Release of Information must be obtained prior to any record release in order to collaborate their care with an outside provider. Patient/Guardian was advised if they have not already done so to contact  the registration department to sign all necessary forms in order for [place] to release information regarding their care.   Consent: Patient/Guardian gives verbal consent for treatment and assignment of benefits for services provided during this visit. Patient/Guardian expressed understanding and agreed to proceed.  I provided 180 minutes of non-face-to-face time during this encounter.   Milana Na, [facility] 01/08/22

## 2022-01-27 ENCOUNTER — Emergency Department (HOSPITAL_COMMUNITY)
Admission: EM | Admit: 2022-01-27 | Discharge: 2022-01-27 | Disposition: A | Discharge status: Home / Self Care | Payer: PRIVATE HEALTH INSURANCE | Attending: Emergency Medicine | Admitting: Emergency Medicine

## 2022-01-27 ENCOUNTER — Encounter (HOSPITAL_COMMUNITY): Payer: Self-pay

## 2022-01-27 ENCOUNTER — Other Ambulatory Visit: Payer: Self-pay

## 2022-01-27 DIAGNOSIS — J449 Chronic obstructive pulmonary disease, unspecified: Secondary | ICD-10-CM | POA: Diagnosis not present

## 2022-01-27 DIAGNOSIS — R739 Hyperglycemia, unspecified: Secondary | ICD-10-CM | POA: Diagnosis present

## 2022-01-27 DIAGNOSIS — D72829 Elevated white blood cell count, unspecified: Secondary | ICD-10-CM | POA: Insufficient documentation

## 2022-01-27 LAB — URINALYSIS, ROUTINE W REFLEX MICROSCOPIC
Bacteria, UA: NONE SEEN
Bilirubin Urine: NEGATIVE
Glucose, UA: >=500 mg/dL — AB
Hgb urine dipstick: NEGATIVE
Ketones, ur: 5 mg/dL — AB
Leukocytes,Ua: NEGATIVE
Nitrite: NEGATIVE
Protein, ur: NEGATIVE mg/dL
Specific Gravity, Urine: 1.036 — ABNORMAL HIGH (ref 1.005–1.030)
pH: 5 (ref 5.0–8.0)

## 2022-01-27 LAB — CBC WITH DIFFERENTIAL/PLATELET
Abs Immature Granulocytes: 0.07 10*3/uL (ref 0.00–0.07)
Basophils Absolute: 0.1 10*3/uL (ref 0.0–0.1)
Basophils Relative: 1 %
Eosinophils Absolute: 0.2 10*3/uL (ref 0.0–0.5)
Eosinophils Relative: 2 %
HCT: 48.7 % — ABNORMAL HIGH (ref 36.0–46.0)
Hemoglobin: 15.9 g/dL — ABNORMAL HIGH (ref 12.0–15.0)
Immature Granulocytes: 1 %
Lymphocytes Relative: 30 %
Lymphs Abs: 4.2 10*3/uL — ABNORMAL HIGH (ref 0.7–4.0)
MCH: 26.5 pg (ref 26.0–34.0)
MCHC: 32.6 g/dL (ref 30.0–36.0)
MCV: 81.2 fL (ref 80.0–100.0)
Monocytes Absolute: 0.6 10*3/uL (ref 0.1–1.0)
Monocytes Relative: 5 %
Neutro Abs: 8.8 10*3/uL — ABNORMAL HIGH (ref 1.7–7.7)
Neutrophils Relative %: 61 %
Platelets: 282 10*3/uL (ref 150–400)
RBC: 6 MIL/uL — ABNORMAL HIGH (ref 3.87–5.11)
RDW: 13.8 % (ref 11.5–15.5)
WBC: 14 10*3/uL — ABNORMAL HIGH (ref 4.0–10.5)
nRBC: 0 % (ref 0.0–0.2)

## 2022-01-27 LAB — COMPREHENSIVE METABOLIC PANEL
ALT: 32 U/L (ref 0–44)
AST: 43 U/L — ABNORMAL HIGH (ref 15–41)
Albumin: 3.4 g/dL — ABNORMAL LOW (ref 3.5–5.0)
Alkaline Phosphatase: 123 U/L (ref 38–126)
Anion gap: 13 (ref 5–15)
BUN: 9 mg/dL (ref 6–20)
CO2: 22 mmol/L (ref 22–32)
Calcium: 9.3 mg/dL (ref 8.9–10.3)
Chloride: 99 mmol/L (ref 98–111)
Creatinine, Ser: 0.5 mg/dL (ref 0.44–1.00)
GFR, Estimated: >60 mL/min (ref >60)
Glucose, Bld: 322 mg/dL — ABNORMAL HIGH (ref 70–99)
Potassium: 4.1 mmol/L (ref 3.5–5.1)
Sodium: 134 mmol/L — ABNORMAL LOW (ref 135–145)
Total Bilirubin: 0.6 mg/dL (ref 0.3–1.2)
Total Protein: 7.3 g/dL (ref 6.5–8.1)

## 2022-01-27 LAB — CBG MONITORING, ED
Glucose-Capillary: 325 mg/dL — ABNORMAL HIGH (ref 70–99)
Glucose-Capillary: 269 mg/dL — ABNORMAL HIGH (ref 70–99)
Glucose-Capillary: 214 mg/dL — ABNORMAL HIGH (ref 70–99)

## 2022-01-27 MED ORDER — LACTATED RINGERS IV BOLUS
1000.0000 mL | Freq: Once | INTRAVENOUS | Status: AC
  Administered 2022-01-27: 1000 mL via INTRAVENOUS

## 2022-01-27 NOTE — ED Triage Notes (Signed)
Patient has been taking her insulin daily and blood sugar running high in 300s and reports ketones in her urine. Alert and oriented. Fatigue for the past week

## 2022-01-27 NOTE — Discharge Instructions (Signed)
Return to the ER if you develop fever, chest pain, new or worsening numbness or weakness, or any other new/concerning symptoms.

## 2022-01-27 NOTE — ED Provider Triage Note (Signed)
Emergency Medicine Provider Triage Evaluation Note  Katherine Blair , a 56 y.o. female  was evaluated in triage.  Pt complains of fatigue for the past few days.  She noticed that her blood sugars were elevated to about 400.  325 in triage.  Denies abdominal pain, chest pain, shortness of breath, or other complaints.  Reports compliance with her insulin.  She is type II diabetic.Marland Kitchen  Review of Systems  Positive: As above Negative: As above  Physical Exam  BP (!) 140/88   Pulse 95   Temp 98.3 F (36.8 C) (Oral)   Resp 18   SpO2 93%  Gen:   Awake, no distress   Resp:  Normal effort  MSK:   Moves extremities without difficulty  Other:    Medical Decision Making  Medically screening exam initiated at 12:48 PM.  Appropriate orders placed.  Katherine Blair was informed that the remainder of the evaluation will be completed by another provider, this initial triage assessment does not replace that evaluation, and the importance of remaining in the ED until their evaluation is complete.     Evlyn Courier, PA-C 01/27/22 1248

## 2022-01-27 NOTE — ED Provider Notes (Signed)
[place] EMERGENCY DEPARTMENT Provider Note   CSN: [id number] Arrival date & time: 01/27/22  1148     History  No chief complaint on file.   Katherine Blair is a 56 y.o. female.  HPI 56 year old female presents with hyperglycemia.  Her glucose has been elevated in the 300s for about a week or so.  However today it was in the 400s on multiple checks.  She went to [facility] and was in the 400s and she had ketones in urine so she was sent here to rule out DKA.  She has been feeling fatigued which is worse this morning.  She has been feeling tingling in her bilateral toes for about a week and they feel numb.  She had some cramping in her legs last night.  She has been urinating frequently.  She reports compliance with her insulin.  She has a chronic cough from COPD but no new respiratory symptoms or concern for infection.  No dysuria.  She states her abdomen was bloated or her in the week but that seems to be better now.  She is currently on azithromycin and has 1 more day left due to her implant coming out in her teeth.  However she states this was a precaution by her dentist and she has not actually had any mouth pain or swelling.  She also states due to cramping in her legs she didn't sleep well last night which she thinks is also contributing to her fatigue.  Home Medications Prior to Admission medications   Medication Sig Start Date End Date Taking? Authorizing Provider  atorvastatin (LIPITOR) 20 MG tablet Take 20 mg by mouth at bedtime.    [provider]  dicyclomine (BENTYL) 20 MG tablet Take 1 tablet (20 mg total) by mouth 4 (four) times daily as needed for spasms. 12/04/19   Molpus, John, MD  Dulaglutide (TRULICITY) 4.5 0000000 SOPN Inject 4.5 mg into the skin once a week. 03/16/20   Renato Shin, MD  GABAPENTIN PO Take by mouth.    [provider]  HYDROcodone-acetaminophen (NORCO/VICODIN) 5-325 MG tablet Take 2 tablets by mouth every  6 (six) hours as needed for severe pain. 12/25/21   Deatra Canter, Amjad, PA-C  insulin glargine, 2 Unit Dial, (TOUJEO MAX SOLOSTAR) 300 UNIT/ML Solostar Pen Inject 110 Units into the skin every morning. 03/16/20   Renato Shin, MD  Insulin Pen Needle (PEN NEEDLES) 32G X 5 MM MISC 1 Device by Does not apply route daily. 09/24/19   Renato Shin, MD  lidocaine (LIDODERM) 5 % Place 1 patch onto the skin daily. Remove & Discard patch within 12 hours or as directed by MD 12/25/21   Evlyn Courier, PA-C  methocarbamol (ROBAXIN) 500 MG tablet Take 1 tablet (500 mg total) by mouth 2 (two) times daily. 12/25/21   Evlyn Courier, PA-C  naproxen (NAPROSYN) 375 MG tablet Take 1 tablet (375 mg total) by mouth 2 (two) times daily. 12/25/21   Evlyn Courier, PA-C  omeprazole (PRILOSEC) 20 MG capsule Take 20 mg by mouth daily.    [provider]  ondansetron (ZOFRAN ODT) 8 MG disintegrating tablet Take 1 tablet (8 mg total) by mouth every 8 (eight) hours as needed for nausea or vomiting. 12/04/19   Molpus, John, MD  PHENTERMINE HCL PO Take by mouth.    [provider]  sertraline (ZOLOFT) 50 MG tablet Take 1.5 tablets (75 mg total) by mouth daily. 01/10/22 01/10/23  Vista Mink, MD  Allergies    Penicillins    Review of Systems   Review of Systems  Constitutional:  Positive for fatigue. Negative for fever.  Respiratory:  Positive for cough. Negative for shortness of breath.   Gastrointestinal:  Negative for abdominal pain.  Genitourinary:  Positive for frequency. Negative for dysuria.  Neurological:  Positive for numbness (tingling in toes).    Physical Exam Updated Vital Signs BP 116/66   Pulse 84   Temp 98.2 F (36.8 C) (Oral)   Resp (!) 22   SpO2 94%  Physical Exam Vitals and nursing note reviewed.  Constitutional:      General: She is not in acute distress.    Appearance: She is well-developed. She is not ill-appearing or diaphoretic.  HENT:     Head: Normocephalic and atraumatic.   Cardiovascular:     Rate and Rhythm: Normal rate and regular rhythm.     Pulses:          Dorsalis pedis pulses are 2+ on the right side and 2+ on the left side.     Heart sounds: Normal heart sounds.  Pulmonary:     Effort: Pulmonary effort is normal.     Breath sounds: Normal breath sounds. No wheezing or rales.  Abdominal:     General: There is no distension.     Palpations: Abdomen is soft.     Tenderness: There is no abdominal tenderness.  Skin:    General: Skin is warm and dry.     Comments: No signs of cellulitis or infection on exam.  Patient indicates that sometimes in her left groin she will get redness or "abscess".  However I examined with the nurse chaperone and there is no obvious abscess or infection in her leg/groin or abdomen.  Neurological:     Mental Status: She is alert.     Comments: Grossly normal sensation in her bilateral feet including her toes.     ED Results / Procedures / Treatments   Labs (all labs ordered are listed, but only abnormal results are displayed) Labs Reviewed  URINALYSIS, ROUTINE W REFLEX MICROSCOPIC - Abnormal; Notable for the following components:      Result Value   APPearance HAZY (*)    Specific Gravity, Urine 1.036 (*)    Glucose, UA >=500 (*)    Ketones, ur 5 (*)    All other components within normal limits  COMPREHENSIVE METABOLIC PANEL - Abnormal; Notable for the following components:   Sodium 134 (*)    Glucose, Bld 322 (*)    Albumin 3.4 (*)    AST 43 (*)    All other components within normal limits  CBC WITH DIFFERENTIAL/PLATELET - Abnormal; Notable for the following components:   WBC 14.0 (*)    RBC 6.00 (*)    Hemoglobin 15.9 (*)    HCT 48.7 (*)    Neutro Abs 8.8 (*)    Lymphs Abs 4.2 (*)    All other components within normal limits  CBG MONITORING, ED - Abnormal; Notable for the following components:   Glucose-Capillary 325 (*)    All other components within normal limits  CBG MONITORING, ED - Abnormal;  Notable for the following components:   Glucose-Capillary 269 (*)    All other components within normal limits  CBG MONITORING, ED - Abnormal; Notable for the following components:   Glucose-Capillary 214 (*)    All other components within normal limits    EKG None  Radiology No results found.  Procedures Procedures    Medications Ordered in ED Medications  lactated ringers bolus 1,000 mL (1,000 mLs Intravenous New Bag/Given 01/27/22 1635)    ED Course/ Medical Decision Making/ A&P                           Medical Decision Making Amount and/or Complexity of Data Reviewed Labs: ordered.    Details: Nonspecific leukocytosis of 14.  She has had similar elevated WBCs but is unclear why it is elevated today.  Hyperglycemic to 322 without anion gap acidosis.  UA without UTI.   I think patient is overall feeling poorly due to her hyperglycemia.  She probably has a little bit of neuropathy in her bilateral toes.  Doubt acute stroke/CNS emergency.  She has this nonspecific leukocytosis but on history and physical I do not find an obvious source of infection.  Her cough is chronic, presentation not consistent with pneumonia today, normal lung sounds/work of breathing.  Urine not consistent with UTI.  No abdominal pain today.  Discussed she will need to follow-up closely with PCP as an outpatient for better glycemic control.  She was given IV fluids here.  Her cramping is likely from some dehydration from hyperglycemia. Given bilateral symptoms and no leg swelling on exam, doubt DVT. Will d/c home with return precautions.        Final Clinical Impression(s) / ED Diagnoses Final diagnoses:  Hyperglycemia    Rx / DC Orders ED Discharge Orders     None         Sherwood Gambler, MD 01/27/22 1756

## 2022-02-05 ENCOUNTER — Other Ambulatory Visit (HOSPITAL_COMMUNITY): Payer: Self-pay | Admitting: Psychiatry

## 2022-02-06 ENCOUNTER — Telehealth (HOSPITAL_COMMUNITY): Payer: PRIVATE HEALTH INSURANCE | Admitting: Psychiatry

## 2022-02-08 ENCOUNTER — Encounter (HOSPITAL_COMMUNITY): Payer: Self-pay | Admitting: Psychiatry

## 2022-02-08 ENCOUNTER — Telehealth (HOSPITAL_BASED_OUTPATIENT_CLINIC_OR_DEPARTMENT_OTHER): Payer: PRIVATE HEALTH INSURANCE | Admitting: Psychiatry

## 2022-02-08 DIAGNOSIS — F411 Generalized anxiety disorder: Secondary | ICD-10-CM

## 2022-02-08 DIAGNOSIS — F331 Major depressive disorder, recurrent, moderate: Secondary | ICD-10-CM

## 2022-02-08 MED ORDER — HYDROXYZINE HCL 10 MG PO TABS: 10.0000 mg | ORAL_TABLET | Freq: Three times a day (TID) | ORAL | 1 refills | Status: DC | PRN

## 2022-02-08 MED ORDER — SERTRALINE HCL 100 MG PO TABS: 100.0000 mg | ORAL_TABLET | Freq: Every day | ORAL | 0 refills | Status: DC

## 2022-02-08 NOTE — Progress Notes (Signed)
BH MD/PA/NP OP Progress Note  02/08/2022 10:29 AM Katherine Blair  MRN:  [id number]  Visit Diagnosis:    ICD-10-CM   1. MDD (major depressive disorder), recurrent episode, moderate (HCC)  F33.1 sertraline (ZOLOFT) 100 MG tablet    hydrOXYzine (ATARAX) 10 MG tablet    2. GAD (generalized anxiety disorder)  F41.1 sertraline (ZOLOFT) 100 MG tablet    hydrOXYzine (ATARAX) 10 MG tablet      Assessment: Katherine Blair is a 56 y.o. y.o. female with a history of MDD who presented to [facility] at [facility] for initial evaluation of depression and anxiety as well as establishment of care on 01/10/22.  Patient completed IOP on 01/09/22.   At initial evaluation Patient reported symptoms of depressed mood, anxiety, racing thoughts, tearfulness, and feelings of being overwhelmed.  The symptoms have improved while she was attending the partial program.  Following her completion of the PHP program patient noted a slight increase in her anxiety and racing thoughts.  She denied any SI/HI or thoughts of self-harm along with any manic symptoms.  Patient noted good initial response from the medication and denied any adverse side effects Discussed the importance of using coping strategies, incorporating structure into her day, and setting boundaries with work going forward.  Patient plans and work on this and notes having good supports in her children and her significant other.  Katherine Blair presents for follow-up evaluation. Today, 02/08/22, patient reports increased anxiety and depression secondary to increased stressors at work and home.  Patient notes financial and insurance concerns over the potential decrease in salary or loss of her job in the near future.  Discussed strategies on how to prepare for this and patient has plans in mind.  Also discussed increasing Zoloft and starting a as needed medication for anxiety.  Went over the risk and benefits of hydroxyzine and patient was open to  starting.  Plan: - TSH WNL - Increase Zoloft to 100 mg QD - Start Atarax 10 mg TID prn for anxiety - Patient looking for a therapist - Follow up in a month   Chief Complaint:  Chief Complaint  Patient presents with   Follow-up   Depression   Anxiety   HPI: Patient presents today reporting that some days are better then others. There has been a lot going on with work and her anxiety has been a bit higher. She is now working from home and is working more hours.  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  They have also informed her that they might have to either cut her salary in half or let her go.  Patient feels like a lot is happening at once which has led to her anxiety going up.  She also has been working more at home so feels a bit more depressed due to not being able to get out of the house.  With her being home her and her husband have been getting into arguments more frequently due to the new nature of the situation.  Patient feels that this could improve as they get used to it and feels bad about the periods of irritability.  [name] feels that she cannot notice anything positive or negative from the increase in the Zoloft and thinks it has to be related to stress increasing at the same time.  She also notes that she has had some difficulty with  concentration more recently.  We discussed treatment options including increasing the Zoloft and adding a as needed option for anxiety.  She notes having tried Klonopin in the past and having a left over prescription from her former provider which she takes in emergencies.  She was also open to trying hydroxyzine for more mild anxiety episodes.  Past Psychiatric History: Patient recently completed an IOP admission from 12/18/21-01/09/22 for worsening depression and anxiety. Patient also had one prior inpatinet pscyhiatric admission when she was 25 following a suicide  attempt to cut her wrists while using substances and alcohol. Lexapro, Celexa, and Klonopin in the past.   Past Medical History:  Past Medical History:  Diagnosis Date   COPD (chronic obstructive pulmonary disease) (HCC)    Cushing's syndrome (HCC)    Diabetes mellitus without complication (HCC)    GERD (gastroesophageal reflux disease)    Hypertension    Thyroid disease     Past Surgical History:  Procedure Laterality Date   BLADDER SURGERY     CESAREAN SECTION     DILATION AND CURETTAGE OF UTERUS     PARTIAL HYSTERECTOMY     TUBAL LIGATION      Family Psychiatric History: Father was Bipolar and her sister struggles with substance use disorder.  Family History:  Family History  Problem Relation Age of Onset   Diabetes Mother    Bipolar disorder Father    Diabetes Father    Drug abuse Sister    Diabetes Sister     Social History:  Social History   Socioeconomic History   Marital status: Single    Spouse name: Not on file   Number of children: Not on file   Years of education: Not on file   Highest education level: Not on file  Occupational History   Not on file  Tobacco Use   Smoking status: Every Day    Packs/day: 1.00    Years: 30.00    Total pack years: 30.00    Types: Cigarettes   Smokeless tobacco: Never  Vaping Use   Vaping Use: Never used  Substance and Sexual Activity   Alcohol use: Not Currently    Comment: social   Drug use: No   Sexual activity: Not on file  Other Topics Concern   Not on file  Social History Narrative   Not on file   Social Determinants of Health   Financial Resource Strain: Not on file  Food Insecurity: Not on file  Transportation Needs: Not on file  Physical Activity: Not on file  Stress: Not on file  Social Connections: Not on file    Allergies:  Allergies  Allergen Reactions   Penicillins Rash    Current Medications: Current Outpatient Medications  Medication Sig Dispense Refill   hydrOXYzine (ATARAX)  10 MG tablet Take 1 tablet (10 mg total) by mouth 3 (three) times daily as needed for anxiety. 90 tablet 1   atorvastatin (LIPITOR) 20 MG tablet Take 20 mg by mouth at bedtime.     dicyclomine (BENTYL) 20 MG tablet Take 1 tablet (20 mg total) by mouth 4 (four) times daily as needed for spasms. 20 tablet 0   Dulaglutide (TRULICITY) 4.5 MG/0.5ML SOPN Inject 4.5 mg into the skin once a week. 6 mL 3   GABAPENTIN PO Take by mouth.     HYDROcodone-acetaminophen (NORCO/VICODIN) 5-325 MG tablet Take 2 tablets by mouth every 6 (six) hours as needed for severe pain. 8 tablet 0   insulin glargine, 2  Unit Dial, (TOUJEO MAX SOLOSTAR) 300 UNIT/ML Solostar Pen Inject 110 Units into the skin every morning. 42 mL 3   Insulin Pen Needle (PEN NEEDLES) 32G X 5 MM MISC 1 Device by Does not apply route daily. 90 each 3   lidocaine (LIDODERM) 5 % Place 1 patch onto the skin daily. Remove & Discard patch within 12 hours or as directed by MD 14 patch 0   methocarbamol (ROBAXIN) 500 MG tablet Take 1 tablet (500 mg total) by mouth 2 (two) times daily. 20 tablet 0   naproxen (NAPROSYN) 375 MG tablet Take 1 tablet (375 mg total) by mouth 2 (two) times daily. 20 tablet 0   omeprazole (PRILOSEC) 20 MG capsule Take 20 mg by mouth daily.     ondansetron (ZOFRAN ODT) 8 MG disintegrating tablet Take 1 tablet (8 mg total) by mouth every 8 (eight) hours as needed for nausea or vomiting. 10 tablet 1   PHENTERMINE HCL PO Take by mouth.     sertraline (ZOLOFT) 100 MG tablet Take 1 tablet (100 mg total) by mouth daily. 30 tablet 0   No current facility-administered medications for this visit.     Psychiatric Specialty Exam: Review of Systems  There were no vitals taken for this visit.There is no height or weight on file to calculate BMI.  General Appearance: Fairly Groomed  Eye Contact:  Good  Speech:  Clear and Coherent  Volume:  Normal  Mood:  Anxious and Irritable  Affect:  Congruent  Thought Process:  Coherent   Orientation:  Full (Time, Place, and Person)  Thought Content: Logical   Suicidal Thoughts:  No  Homicidal Thoughts:  No  Memory:  NA  Judgement:  Good  Insight:  Fair  Psychomotor Activity:  Normal  Concentration:  Concentration: Fair  Recall:  Good  Fund of Knowledge: Good  Language: Good  Akathisia:  No    AIMS (if indicated): not done  Assets:  Communication Skills Desire for Improvement Financial Resources/Insurance Housing  ADL's:  Intact  Cognition: WNL  Sleep:  Fair   Metabolic Disorder Labs: Lab Results  Component Value Date   HGBA1C 6.7 (A) 09/24/2019   No results found for: "PROLACTIN" No results found for: "CHOL", "TRIG", "HDL", "CHOLHDL", "VLDL", "LDLCALC" Lab Results  Component Value Date   TSH 2.45 03/31/2019   TSH 1.852 04/26/2014    Therapeutic Level Labs: No results found for: "LITHIUM" No results found for: "VALPROATE" No results found for: "CBMZ"   Screenings: PHQ2-9    Flowsheet Row Counselor from 12/14/2021 in BEHAVIORAL HEALTH INTENSIVE PSYCH  PHQ-2 Total Score 5  PHQ-9 Total Score 17      Flowsheet Row ED from 01/27/2022 in [facility] EM[facility]PART[facility] ED from 12/25/2021 in [facility] EMERGENCY DEPART[facility] Counselor from 12/14/2021 in BEHAVIORAL HEALTH INTENSIVE PSYCH  C-SSRS RISK CATEGORY No Risk No Risk Error: Question 6 not populated       Collaboration of Care: Collaboration of Care: Medication Management AEB medication prescription  Patient/Guardian was advised Release of Information must be obtained prior to any record release in order to collaborate their care with an outside provider. Patient/Guardian was advised if they have not already done so to contact the registration department to sign all necessary forms in order for [place] to release information regarding their care.   Consent: Patient/Guardian gives verbal consent for treatment and assignment of benefits for services provided during  this visit. Patient/Guardian expressed understanding and agreed to proceed.  Vista Mink, MD 02/08/2022, 10:29 AM   Virtual Visit via Video Note  I connected with Parks Ranger on 02/08/22 at 10:00 AM EDT by a video enabled telemedicine application and verified that I am speaking with the correct person using two identifiers.  Location: Patient: Home Provider: Home Office   I discussed the limitations of evaluation and management by telemedicine and the availability of in person appointments. The patient expressed understanding and agreed to proceed.   I discussed the assessment and treatment plan with the patient. The patient was provided an opportunity to ask questions and all were answered. The patient agreed with the plan and demonstrated an understanding of the instructions.   The patient was advised to call back or seek an in-person evaluation if the symptoms worsen or if the condition fails to improve as anticipated.  I provided 25 minutes of non-face-to-face time during this encounter.   Vista Mink, MD

## 2022-02-09 ENCOUNTER — Telehealth (HOSPITAL_COMMUNITY): Payer: PRIVATE HEALTH INSURANCE | Admitting: Psychiatry

## 2022-03-01 ENCOUNTER — Telehealth (HOSPITAL_COMMUNITY): Payer: PRIVATE HEALTH INSURANCE | Admitting: Psychiatry

## 2022-03-04 ENCOUNTER — Other Ambulatory Visit (HOSPITAL_COMMUNITY): Payer: Self-pay | Admitting: Psychiatry

## 2022-03-04 DIAGNOSIS — F331 Major depressive disorder, recurrent, moderate: Secondary | ICD-10-CM

## 2022-03-04 DIAGNOSIS — F411 Generalized anxiety disorder: Secondary | ICD-10-CM

## 2022-03-30 ENCOUNTER — Other Ambulatory Visit (HOSPITAL_COMMUNITY): Payer: Self-pay | Admitting: Psychiatry

## 2022-03-30 DIAGNOSIS — F411 Generalized anxiety disorder: Secondary | ICD-10-CM

## 2022-03-30 DIAGNOSIS — F331 Major depressive disorder, recurrent, moderate: Secondary | ICD-10-CM

## 2022-04-09 LAB — HM COLONOSCOPY

## 2022-04-27 ENCOUNTER — Other Ambulatory Visit (HOSPITAL_COMMUNITY): Payer: Self-pay | Admitting: Psychiatry

## 2022-04-27 ENCOUNTER — Other Ambulatory Visit (HOSPITAL_COMMUNITY): Payer: Self-pay | Admitting: Family

## 2022-04-27 DIAGNOSIS — F411 Generalized anxiety disorder: Secondary | ICD-10-CM

## 2022-04-27 DIAGNOSIS — F331 Major depressive disorder, recurrent, moderate: Secondary | ICD-10-CM

## 2022-04-29 ENCOUNTER — Other Ambulatory Visit (HOSPITAL_COMMUNITY): Payer: Self-pay | Admitting: Psychiatry

## 2022-04-29 DIAGNOSIS — F411 Generalized anxiety disorder: Secondary | ICD-10-CM

## 2022-04-29 DIAGNOSIS — F331 Major depressive disorder, recurrent, moderate: Secondary | ICD-10-CM

## 2022-09-24 ENCOUNTER — Emergency Department (HOSPITAL_BASED_OUTPATIENT_CLINIC_OR_DEPARTMENT_OTHER)
Admission: EM | Admit: 2022-09-24 | Discharge: 2022-09-24 | Disposition: A | Discharge status: Home / Self Care | Payer: Self-pay | Attending: Emergency Medicine | Admitting: Emergency Medicine

## 2022-09-24 ENCOUNTER — Other Ambulatory Visit: Payer: Self-pay

## 2022-09-24 DIAGNOSIS — E1165 Type 2 diabetes mellitus with hyperglycemia: Secondary | ICD-10-CM | POA: Insufficient documentation

## 2022-09-24 DIAGNOSIS — Z794 Long term (current) use of insulin: Secondary | ICD-10-CM | POA: Insufficient documentation

## 2022-09-24 DIAGNOSIS — L02214 Cutaneous abscess of groin: Secondary | ICD-10-CM | POA: Insufficient documentation

## 2022-09-24 DIAGNOSIS — R739 Hyperglycemia, unspecified: Secondary | ICD-10-CM

## 2022-09-24 LAB — URINALYSIS, ROUTINE W REFLEX MICROSCOPIC
Bacteria, UA: NONE SEEN
Bilirubin Urine: NEGATIVE
Glucose, UA: >1000 mg/dL — AB
Hgb urine dipstick: NEGATIVE
Ketones, ur: 15 mg/dL — AB
Leukocytes,Ua: NEGATIVE
Nitrite: NEGATIVE
Protein, ur: NEGATIVE mg/dL
Specific Gravity, Urine: 1.038 — ABNORMAL HIGH (ref 1.005–1.030)
pH: 5.5 (ref 5.0–8.0)

## 2022-09-24 LAB — BASIC METABOLIC PANEL
Anion gap: 10 (ref 5–15)
BUN: 9 mg/dL (ref 6–20)
CO2: 23 mmol/L (ref 22–32)
Calcium: 9.2 mg/dL (ref 8.9–10.3)
Chloride: 100 mmol/L (ref 98–111)
Creatinine, Ser: 0.52 mg/dL (ref 0.44–1.00)
GFR, Estimated: >60 mL/min (ref >60)
Glucose, Bld: 422 mg/dL — ABNORMAL HIGH (ref 70–99)
Potassium: 4.3 mmol/L (ref 3.5–5.1)
Sodium: 133 mmol/L — ABNORMAL LOW (ref 135–145)

## 2022-09-24 LAB — CBC
HCT: 47.1 % — ABNORMAL HIGH (ref 36.0–46.0)
Hemoglobin: 15.9 g/dL — ABNORMAL HIGH (ref 12.0–15.0)
MCH: 26.7 pg (ref 26.0–34.0)
MCHC: 33.8 g/dL (ref 30.0–36.0)
MCV: 79.2 fL — ABNORMAL LOW (ref 80.0–100.0)
Platelets: 248 10*3/uL (ref 150–400)
RBC: 5.95 MIL/uL — ABNORMAL HIGH (ref 3.87–5.11)
RDW: 13.6 % (ref 11.5–15.5)
WBC: 10 10*3/uL (ref 4.0–10.5)
nRBC: 0 % (ref 0.0–0.2)

## 2022-09-24 LAB — CBG MONITORING, ED
Glucose-Capillary: 408 mg/dL — ABNORMAL HIGH (ref 70–99)
Glucose-Capillary: 298 mg/dL — ABNORMAL HIGH (ref 70–99)
Glucose-Capillary: 271 mg/dL — ABNORMAL HIGH (ref 70–99)

## 2022-09-24 LAB — I-STAT VENOUS BLOOD GAS, ED
Acid-base deficit: 1 mmol/L (ref 0.0–2.0)
Bicarbonate: 25.3 mmol/L (ref 20.0–28.0)
Calcium, Ion: 1.21 mmol/L (ref 1.15–1.40)
HCT: 50 % — ABNORMAL HIGH (ref 36.0–46.0)
Hemoglobin: 17 g/dL — ABNORMAL HIGH (ref 12.0–15.0)
O2 Saturation: 55 %
Potassium: 4.4 mmol/L (ref 3.5–5.1)
Sodium: 134 mmol/L — ABNORMAL LOW (ref 135–145)
TCO2: 27 mmol/L (ref 22–32)
pCO2, Ven: 46.3 mmHg (ref 44–60)
pH, Ven: 7.346 (ref 7.25–7.43)
pO2, Ven: 31 mmHg — CL (ref 32–45)

## 2022-09-24 MED ORDER — SULFAMETHOXAZOLE-TRIMETHOPRIM 800-160 MG PO TABS: 1.0000 | ORAL_TABLET | Freq: Two times a day (BID) | ORAL | 0 refills | Status: AC

## 2022-09-24 MED ORDER — LACTATED RINGERS IV BOLUS
1000.0000 mL | Freq: Once | INTRAVENOUS | Status: AC
  Administered 2022-09-24: 1000 mL via INTRAVENOUS

## 2022-09-24 MED ORDER — BASAGLAR KWIKPEN 100 UNIT/ML ~~LOC~~ SOPN: 130.0000 [IU] | PEN_INJECTOR | Freq: Every day | SUBCUTANEOUS | 0 refills | Status: DC

## 2022-09-24 NOTE — ED Provider Notes (Signed)
[place] EMERGENCY DEPARTMENT AT [facility] Provider Note   CSN: [id number] Arrival date & time: 09/24/22  4098     History  Chief Complaint  Patient presents with   Hyperglycemia    Katherine Blair is a 57 y.o. female.  Patient with history of type 2 diabetes on insulin pen presents to the emergency department for evaluation of hyperglycemia.  Patient reports having run out of her insulin due to lack of insurance coverage about 10 days ago.  She states that she started a new job but does not have insurance coverage to pay for medications for another 10 days.  She reports increased thirst and urinary frequency.  She reports weight loss over the past several days.  She has taken a urine ketone test at home which earlier in the week was showing small ketones, but today showed moderate to severe ketones prompting emergency department visit.  She denies associated fevers or infectious type of symptoms.  She has had a couple of small abscesses come up in the groin area of which 1 is draining.  She has not had any abdominal pain or vomiting.       Home Medications Prior to Admission medications   Medication Sig Start Date End Date Taking? Authorizing Provider  atorvastatin (LIPITOR) 20 MG tablet Take 20 mg by mouth at bedtime.    [provider]  dicyclomine (BENTYL) 20 MG tablet Take 1 tablet (20 mg total) by mouth 4 (four) times daily as needed for spasms. 12/04/19   Molpus, John, MD  Dulaglutide (TRULICITY) 4.5 MG/0.5ML SOPN Inject 4.5 mg into the skin once a week. 03/16/20   Romero Belling, MD  GABAPENTIN PO Take by mouth.    [provider]  HYDROcodone-acetaminophen (NORCO/VICODIN) 5-325 MG tablet Take 2 tablets by mouth every 6 (six) hours as needed for severe pain. 12/25/21   Karie Mainland, Amjad, PA-C  hydrOXYzine (ATARAX) 10 MG tablet TAKE 1 TABLET BY MOUTH 3 TIMES DAILY AS NEEDED FOR ANXIETY. 03/05/22   Stasia Cavalier, MD  insulin glargine, 2 Unit Dial, (TOUJEO MAX  SOLOSTAR) 300 UNIT/ML Solostar Pen Inject 110 Units into the skin every morning. 03/16/20   Romero Belling, MD  Insulin Pen Needle (PEN NEEDLES) 32G X 5 MM MISC 1 Device by Does not apply route daily. 09/24/19   Romero Belling, MD  lidocaine (LIDODERM) 5 % Place 1 patch onto the skin daily. Remove & Discard patch within 12 hours or as directed by MD 12/25/21   Marita Kansas, PA-C  methocarbamol (ROBAXIN) 500 MG tablet Take 1 tablet (500 mg total) by mouth 2 (two) times daily. 12/25/21   Marita Kansas, PA-C  naproxen (NAPROSYN) 375 MG tablet Take 1 tablet (375 mg total) by mouth 2 (two) times daily. 12/25/21   Marita Kansas, PA-C  omeprazole (PRILOSEC) 20 MG capsule Take 20 mg by mouth daily.    [provider]  ondansetron (ZOFRAN ODT) 8 MG disintegrating tablet Take 1 tablet (8 mg total) by mouth every 8 (eight) hours as needed for nausea or vomiting. 12/04/19   Molpus, John, MD  PHENTERMINE HCL PO Take by mouth.    [provider]  sertraline (ZOLOFT) 100 MG tablet TAKE 1 TABLET BY MOUTH EVERY DAY 03/05/22   Stasia Cavalier, MD      Allergies    Penicillins    Review of Systems   Review of Systems  Physical Exam Updated Vital Signs BP (!) 148/78   Pulse 95   Temp  98.2 F (36.8 C) (Oral)   Resp 20   Wt 83.5 kg   SpO2 95%   BMI 29.70 kg/m   Physical Exam Vitals and nursing note reviewed.  Constitutional:      General: She is not in acute distress.    Appearance: She is well-developed.  HENT:     Head: Normocephalic and atraumatic.     Right Ear: External ear normal.     Left Ear: External ear normal.     Nose: Nose normal.     Mouth/Throat:     Mouth: Mucous membranes are dry.  Eyes:     Conjunctiva/sclera: Conjunctivae normal.  Cardiovascular:     Rate and Rhythm: Normal rate and regular rhythm.     Heart sounds: No murmur heard. Pulmonary:     Effort: No respiratory distress.     Breath sounds: No wheezing, rhonchi or rales.  Abdominal:     Palpations: Abdomen is  soft.     Tenderness: There is no abdominal tenderness. There is no guarding or rebound.  Musculoskeletal:     Cervical back: Normal range of motion and neck supple.     Right lower leg: No edema.     Left lower leg: No edema.  Skin:    General: Skin is warm and dry.     Findings: No rash.     Comments: Patient with a couple small superficial abscesses in the groin creases, no significant fluctuance.  Area is moderately tender.  Neurological:     General: No focal deficit present.     Mental Status: She is alert. Mental status is at baseline.     Motor: No weakness.  Psychiatric:        Mood and Affect: Mood normal.     ED Results / Procedures / Treatments   Labs (all labs ordered are listed, but only abnormal results are displayed) Labs Reviewed  BASIC METABOLIC PANEL - Abnormal; Notable for the following components:      Result Value   Sodium 133 (*)    Glucose, Bld 422 (*)    All other components within normal limits  CBC - Abnormal; Notable for the following components:   RBC 5.95 (*)    Hemoglobin 15.9 (*)    HCT 47.1 (*)    MCV 79.2 (*)    All other components within normal limits  URINALYSIS, ROUTINE W REFLEX MICROSCOPIC - Abnormal; Notable for the following components:   Specific Gravity, Urine 1.038 (*)    Glucose, UA >1,000 (*)    Ketones, ur 15 (*)    All other components within normal limits  CBG MONITORING, ED - Abnormal; Notable for the following components:   Glucose-Capillary 408 (*)    All other components within normal limits  CBG MONITORING, ED - Abnormal; Notable for the following components:   Glucose-Capillary 298 (*)    All other components within normal limits  I-STAT VENOUS BLOOD GAS, ED - Abnormal; Notable for the following components:   pO2, Ven 31 (*)    Sodium 134 (*)    HCT 50.0 (*)    Hemoglobin 17.0 (*)    All other components within normal limits  CBG MONITORING, ED    EKG None  Radiology No results  found.  Procedures Procedures    Medications Ordered in ED Medications  lactated ringers bolus 1,000 mL (0 mLs Intravenous Stopped 09/24/22 1021)  lactated ringers bolus 1,000 mL (0 mLs Intravenous Stopped 09/24/22 1122)  ED Course/ Medical Decision Making/ A&P    Patient seen and examined. History obtained directly from patient.   Labs/EKG: Ordered CBC, BMP, blood gas, UA.  CBG is 408  Imaging: None ordered  Medications/Fluids: Ordered: 2 L lactated Ringer's  Most recent vital signs reviewed and are as follows: BP (!) 148/78   Pulse 95   Temp 98.2 F (36.8 C) (Oral)   Resp 20   Wt 83.5 kg   SpO2 95%   BMI 29.70 kg/m   Initial impression: Hyperglycemia, low clinical concern for DKA, patient does appear to be somewhat dehydrated.  I suspect she will feel better with fluid administration.  Will see what her blood sugar looks like after she is hydrated.  TOC consult placed as she states she will not be able to obtain medications until her insurance coverage starts which will be at least another 10 days.  12:31 PM Reassessment performed. Patient appears stable.  Her blood sugar has improved to 290 after 2 liters of lactated Ringer's.  Transitions of care case manager has been assisting in obtaining match for patient to obtain medications.  Patient has been provided information and will pick up her insulin at the pharmacy today.  I will send this in.  Patient believes that she was currently on 130 units.  Her most recent prescription in our system, from about 3 years ago, showed 110 units of the same insulin went to see different brand name).  I have asked the patient to ensure that she is taking the same amount as she was recently as I do not have any documentation of her most recent dosage.  Labs personally reviewed and interpreted including: CBC with normal white blood cell count, hemoglobin elevated at 15.9 likely in part due to dehydration; BMP with glucose 422, anion gap  normal, bicarb normal, normal kidney function; venous blood gas with normal pH and bicarb; UA with 15 ketones, glucose, concentrated as expected, no compelling signs of infection.  Reviewed pertinent lab work and imaging with patient at bedside. Questions answered.   Most current vital signs reviewed and are as follows: BP 120/74 (BP Location: Right Arm)   Pulse 74   Temp 98.2 F (36.8 C) (Oral)   Resp 20   Wt 83.5 kg   SpO2 94%   BMI 29.70 kg/m   Plan: Discharge to home.   Prescriptions written for: Basaglar KwikPen, Bactrim  Other home care instructions discussed: Encouraged good hydration, insulin compliance  ED return instructions discussed: Return with worsening symptoms, abdominal pain, vomiting  Follow-up instructions discussed: Patient encouraged to follow-up with their PCP in 3 days for recheck and to discuss medication management.                              Medical Decision Making Amount and/or Complexity of Data Reviewed Labs: ordered.  Risk Prescription drug management.   Patient here today with hyperglycemia, fatigue.  She does not have any evidence of DKA clinically or on lab work today.  Her blood sugars were treated and improved into the 200s with IV fluids.  Transitions of care involved to help patient obtain home medications that she will not have insurance coverage for couple of weeks yet.  She does have some small abscesses in the groin area which do not require I&D today.  I do not suspect that she is septic.  She would likely benefit from Bactrim and blood sugar control for  these.  Appropriate insulin dosage discussed with patient.  She appears stable for discharge at this time.  The patient's vital signs, pertinent lab work and imaging were reviewed and interpreted as discussed in the ED course. Hospitalization was considered for further testing, treatments, or serial exams/observation. However as patient is well-appearing, has a stable exam, and  reassuring studies today, I do not feel that they warrant admission at this time. This plan was discussed with the patient who verbalizes agreement and comfort with this plan and seems reliable and able to return to the Emergency Department with worsening or changing symptoms.          Final Clinical Impression(s) / ED Diagnoses Final diagnoses:  Hyperglycemia  Cutaneous abscess of groin    Rx / DC Orders ED Discharge Orders          Ordered    Insulin Glargine [facility] KWIKPEN) 100 UNIT/ML  Daily        09/24/22 1230    sulfamethoxazole-trimethoprim (BACTRIM DS) 800-160 MG tablet  2 times daily        09/24/22 1230              Renne Crigler, PA-C 09/24/22 1235    Arby Barrette, MD 09/25/22 1601

## 2022-09-24 NOTE — ED Triage Notes (Signed)
Pt has been out of her injectable for DM for 10 days, she did a ketone test today and it was high.does not check her blood sugar. Feels tired, eyes blurry . Pt has 2 abscess, one one each groin , and one on her bottom.

## 2022-09-24 NOTE — Care Management (Addendum)
Transition of Care [facility]) - Emergency Department Mini Assessment   Patient Details  Name: Katherine Blair MRN: [id number] Date of Birth: 1966-03-30  [facility] CM/SW Contact:    Lavenia Atlas, RN Phone Number: 09/24/2022, 9:52 AM   Clinical Narrative:  Received TOC consult for medication assistance. Per EDP patent is not DKA and no plans for admission. Patient is in between jobs awaiting on new insurance to start. Patient is eligible for [facility] program. [facility] OP pharmacy is closed. This RNCM attempted to speak with patient however unsuccessful. EDP is aware the patient will need to pay $3 copay per medication. This RNCM notified EDP will need the name of which pharmacy patient plans to use at discharge.   TOC will continue follow.  - 10:27am This RNCM spoke with patient who reports her new insurance will start in 10 days. This RN CM explained the [facility] program. Patient is eligible and reports she can afford the $3 copay per medication. [facility] on [address]. This RNCM [facility]ed [facility]ATCH [facility] to [facility] [address] [phone]. Notified EDP, MD  Transportation at discharge: self/family  No additional TOC needs    ED Mini Assessment: What brought you to the Emergency Department? : out of diabetic medications  Barriers to Discharge: Continued Medical Work up  [employer] interventions: medication assistance     Interventions which prevented an admission or readmission: Medication Review    Patient Contact and Communications        ,          Patient states their goals for this hospitalization and ongoing recovery are:: return home CMS Medicare.gov Compare Post Acute Care list provided to:: Patient Choice offered to / list presented to : Patient  Admission diagnosis:  High Blood Sugar Patient Active Problem List   Diagnosis Date Noted   GAD (generalized anxiety disorder) 02/08/2022   MDD (major depressive disorder), recurrent episode,  moderate (HCC) 01/09/2022   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 [place] [address] Phone: [phone] Fax: [phone]

## 2022-09-24 NOTE — Discharge Instructions (Addendum)
MATCH Medication Assistance Card Name: Ambri Da ID (MRN): [id number] Bin: [id number]                                          RX Group: [id number] Discharge Date: 09/24/22 Expiration Date:10/02/2022                                                                                                    (must be filled within 7 days of discharge)         You have been approved to have the prescriptions written by your discharging physician filled through our [facility] (Medication Assistance Through [facility]) program. This program allows for a one-time (no refills) 34-day supply of selected medications for a low copay amount.   The copay is $3.00 per prescription. For instance, if you have one prescription, you will pay $3.00; for two prescriptions, you pay $6.00; for three prescriptions, you pay $9.00; and so on.   Only certain pharmacies are participating in this program with [facility]. You will need to select one of the pharmacies from the attached list and take your prescriptions, this letter, and your photo ID to one of the [facility] Health Outpatient pharmacies, [facility], [facility] [address], or [facility] [address].    We are excited that you are able to use the [facility] program to get your medications. These prescriptions must be filled within 7 days of hospital discharge or they will no longer be valid for the [facility] program. Should you have any problems with your prescriptions please contact your case management team member at [phone] for [place]/ [facility]   Thank you, [facility] Health Care Management

## 2022-09-24 NOTE — Care Management (Signed)
  MATCH Medication Assistance Card Name: Naveen Verdun ID (MRN): [id number] Bin: [id number] RX Group: [id number] Discharge Date: 09/24/22 Expiration Date:10/02/2022                                           (must be filled within 7 days of discharge)     You have been approved to have the prescriptions written by your discharging physician filled through our [facility] (Medication Assistance Through [facility]) program. This program allows for a one-time (no refills) 34-day supply of selected medications for a low copay amount.  The copay is $3.00 per prescription. For instance, if you have one prescription, you will pay $3.00; for two prescriptions, you pay $6.00; for three prescriptions, you pay $9.00; and so on.  Only certain pharmacies are participating in this program with [facility] You will need to select one of the pharmacies from the attached list and take your prescriptions, this letter, and your photo ID to one of the [facility] Health Outpatient pharmacies, [facility], [facility] [address] or [facility] [address].   We are excited that you are able to use the [facility] program to get your medications. These prescriptions must be filled within 7 days of hospital discharge or they will no longer be valid for the [facility] program. Should you have any problems with your prescriptions please contact your case management team member at [phone] for [place]/ [facility].  Thank you, [facility] Health Care Management

## 2022-09-24 NOTE — ED Notes (Addendum)
VBG completed while patient was on room air. It was handed to charge nurse and Dr. Donnald Garre

## 2022-11-24 ENCOUNTER — Emergency Department (HOSPITAL_COMMUNITY)
Admission: EM | Admit: 2022-11-24 | Discharge: 2022-11-24 | Disposition: A | Discharge status: Home / Self Care | Payer: Self-pay | Attending: Emergency Medicine | Admitting: Emergency Medicine

## 2022-11-24 ENCOUNTER — Encounter (HOSPITAL_COMMUNITY): Payer: Self-pay

## 2022-11-24 ENCOUNTER — Other Ambulatory Visit: Payer: Self-pay

## 2022-11-24 DIAGNOSIS — Z794 Long term (current) use of insulin: Secondary | ICD-10-CM | POA: Insufficient documentation

## 2022-11-24 DIAGNOSIS — L0291 Cutaneous abscess, unspecified: Secondary | ICD-10-CM

## 2022-11-24 DIAGNOSIS — E1165 Type 2 diabetes mellitus with hyperglycemia: Secondary | ICD-10-CM | POA: Insufficient documentation

## 2022-11-24 DIAGNOSIS — L0231 Cutaneous abscess of buttock: Secondary | ICD-10-CM | POA: Insufficient documentation

## 2022-11-24 DIAGNOSIS — R739 Hyperglycemia, unspecified: Secondary | ICD-10-CM

## 2022-11-24 LAB — CBG MONITORING, ED: Glucose-Capillary: 279 mg/dL — ABNORMAL HIGH (ref 70–99)

## 2022-11-24 LAB — CBC WITH DIFFERENTIAL/PLATELET
Abs Immature Granulocytes: 0.05 10*3/uL (ref 0.00–0.07)
Basophils Absolute: 0.1 10*3/uL (ref 0.0–0.1)
Basophils Relative: 1 %
Eosinophils Absolute: 0.3 10*3/uL (ref 0.0–0.5)
Eosinophils Relative: 2 %
HCT: 47.4 % — ABNORMAL HIGH (ref 36.0–46.0)
Hemoglobin: 15.6 g/dL — ABNORMAL HIGH (ref 12.0–15.0)
Immature Granulocytes: 0 %
Lymphocytes Relative: 29 %
Lymphs Abs: 3.5 10*3/uL (ref 0.7–4.0)
MCH: 27.2 pg (ref 26.0–34.0)
MCHC: 32.9 g/dL (ref 30.0–36.0)
MCV: 82.6 fL (ref 80.0–100.0)
Monocytes Absolute: 0.6 10*3/uL (ref 0.1–1.0)
Monocytes Relative: 5 %
Neutro Abs: 7.6 10*3/uL (ref 1.7–7.7)
Neutrophils Relative %: 63 %
Platelets: 245 10*3/uL (ref 150–400)
RBC: 5.74 MIL/uL — ABNORMAL HIGH (ref 3.87–5.11)
RDW: 13.5 % (ref 11.5–15.5)
WBC: 12.1 10*3/uL — ABNORMAL HIGH (ref 4.0–10.5)
nRBC: 0 % (ref 0.0–0.2)

## 2022-11-24 LAB — COMPREHENSIVE METABOLIC PANEL
ALT: 30 U/L (ref 0–44)
AST: 27 U/L (ref 15–41)
Albumin: 3.3 g/dL — ABNORMAL LOW (ref 3.5–5.0)
Alkaline Phosphatase: 100 U/L (ref 38–126)
Anion gap: 10 (ref 5–15)
BUN: 7 mg/dL (ref 6–20)
CO2: 20 mmol/L — ABNORMAL LOW (ref 22–32)
Calcium: 8.2 mg/dL — ABNORMAL LOW (ref 8.9–10.3)
Chloride: 102 mmol/L (ref 98–111)
Creatinine, Ser: 0.47 mg/dL (ref 0.44–1.00)
GFR, Estimated: >60 mL/min (ref >60)
Glucose, Bld: 356 mg/dL — ABNORMAL HIGH (ref 70–99)
Potassium: 3.6 mmol/L (ref 3.5–5.1)
Sodium: 132 mmol/L — ABNORMAL LOW (ref 135–145)
Total Bilirubin: 0.9 mg/dL (ref 0.3–1.2)
Total Protein: 6.9 g/dL (ref 6.5–8.1)

## 2022-11-24 MED ORDER — INSULIN ASPART 100 UNIT/ML IJ SOLN
10.0000 [IU] | Freq: Once | INTRAMUSCULAR | Status: AC
  Administered 2022-11-24: 10 [IU] via SUBCUTANEOUS
  Filled 2022-11-24: qty 1

## 2022-11-24 MED ORDER — SODIUM CHLORIDE 0.9 % IV BOLUS
1000.0000 mL | Freq: Once | INTRAVENOUS | Status: AC
  Administered 2022-11-24: 1000 mL via INTRAVENOUS

## 2022-11-24 MED ORDER — LIDOCAINE-EPINEPHRINE (PF) 2 %-1:200000 IJ SOLN
INTRAMUSCULAR | Status: AC
  Filled 2022-11-24: qty 20

## 2022-11-24 MED ORDER — DOXYCYCLINE HYCLATE 100 MG PO CAPS: 100.0000 mg | ORAL_CAPSULE | Freq: Two times a day (BID) | ORAL | 0 refills | Status: DC

## 2022-11-24 MED ORDER — BASAGLAR KWIKPEN 100 UNIT/ML ~~LOC~~ SOPN: 130.0000 [IU] | PEN_INJECTOR | Freq: Every day | SUBCUTANEOUS | 0 refills | Status: DC

## 2022-11-24 MED ORDER — VANCOMYCIN HCL IN DEXTROSE 1-5 GM/200ML-% IV SOLN: 1000.0000 mg | Freq: Once | INTRAVENOUS | Status: DC

## 2022-11-24 MED ORDER — VANCOMYCIN HCL 1500 MG/300ML IV SOLN
1500.0000 mg | Freq: Once | INTRAVENOUS | Status: AC
  Administered 2022-11-24: 1500 mg via INTRAVENOUS
  Filled 2022-11-24: qty 300

## 2022-11-24 NOTE — ED Provider Triage Note (Signed)
Emergency Medicine Provider Triage Evaluation Note  Katherine Blair , a 57 y.o. female  was evaluated in triage.  Pt complains of abscesses to bilateral inguinal area and vaginal area ongoing intermittently for the past 2 months, seen in the [facility] ED in May and given p.o. antibiotics which she states did not help.  States they more painful and larger.  States she does not have a PCP so she has follow-up with anybody.  He also reports blood sugars been running high and notes feeling less energetic recently.  Review of Systems  Positive: abscesses Negative: fever  Physical Exam  BP 138/82 (BP Location: Right Arm)   Pulse 95   Temp 97.7 F (36.5 C)   Resp 17   Ht 5\' 6"  (1.676 m)   Wt 78.5 kg   SpO2 95%   BMI 27.92 kg/m  Gen:   Awake, no distress   Resp:  Normal effort  MSK:   Moves extremities without difficulty  Other:    Medical Decision Making  Medically screening exam initiated at 6:00 PM.  Appropriate orders placed.  Katherine Blair was informed that the remainder of the evaluation will be completed by another provider, this initial triage assessment does not replace that evaluation, and the importance of remaining in the ED until their evaluation is complete.     Ma Rings, [place] 11/24/22 1805

## 2022-11-24 NOTE — ED Provider Notes (Signed)
[place] EMERGENCY DEPARTMENT AT [facility] Provider Note   CSN: [id number] Arrival date & time: 11/24/22  1557     History  Chief Complaint  Patient presents with   Abscess    Katherine Blair is a 57 y.o. female.   Abscess This patient is a 57 year old female, she is currently an untreated diabetic, she has a history of hyperglycemia and has not been on medications for a couple of months after running out of the medication and insurance.  She presents today with a complaint of multiple small abscesses which have been intermittent for several months, this is associated with a little bit of weight loss, some dehydration and chronic urinary frequency, frequent thirst.  She reports that most of the time the small abscesses will drain when she does warm compresses.  She has not been on antibiotics, they are currently present in the right proximal medial thigh as well as the suprapubic region     Home Medications Prior to Admission medications   Medication Sig Start Date End Date Taking? Authorizing Provider  doxycycline (VIBRAMYCIN) 100 MG capsule Take 1 capsule (100 mg total) by mouth 2 (two) times daily. 11/24/22  Yes Eber Hong, MD  atorvastatin (LIPITOR) 20 MG tablet Take 20 mg by mouth at bedtime.    [provider]  dicyclomine (BENTYL) 20 MG tablet Take 1 tablet (20 mg total) by mouth 4 (four) times daily as needed for spasms. 12/04/19   Molpus, John, MD  Dulaglutide (TRULICITY) 4.5 MG/0.5ML SOPN Inject 4.5 mg into the skin once a week. 03/16/20   Romero Belling, MD  GABAPENTIN PO Take by mouth.    [provider]  HYDROcodone-acetaminophen (NORCO/VICODIN) 5-325 MG tablet Take 2 tablets by mouth every 6 (six) hours as needed for severe pain. 12/25/21   Karie Mainland, Amjad, PA-C  hydrOXYzine (ATARAX) 10 MG tablet TAKE 1 TABLET BY MOUTH 3 TIMES DAILY AS NEEDED FOR ANXIETY. 03/05/22   Stasia Cavalier, MD  Insulin Glargine [facility]) 100 UNIT/ML Inject 130 Units  into the skin daily. 11/24/22   Eber Hong, MD  Insulin Pen Needle (PEN NEEDLES) 32G X 5 MM MISC 1 Device by Does not apply route daily. 09/24/19   Romero Belling, MD  lidocaine (LIDODERM) 5 % Place 1 patch onto the skin daily. Remove & Discard patch within 12 hours or as directed by MD 12/25/21   Marita Kansas, PA-C  methocarbamol (ROBAXIN) 500 MG tablet Take 1 tablet (500 mg total) by mouth 2 (two) times daily. 12/25/21   Marita Kansas, PA-C  naproxen (NAPROSYN) 375 MG tablet Take 1 tablet (375 mg total) by mouth 2 (two) times daily. 12/25/21   Marita Kansas, PA-C  omeprazole (PRILOSEC) 20 MG capsule Take 20 mg by mouth daily.    [provider]  ondansetron (ZOFRAN ODT) 8 MG disintegrating tablet Take 1 tablet (8 mg total) by mouth every 8 (eight) hours as needed for nausea or vomiting. 12/04/19   Molpus, John, MD  PHENTERMINE HCL PO Take by mouth.    [provider]  sertraline (ZOLOFT) 100 MG tablet TAKE 1 TABLET BY MOUTH EVERY DAY 03/05/22   Stasia Cavalier, MD      Allergies    Penicillins    Review of Systems   Review of Systems  All other systems reviewed and are negative.   Physical Exam Updated Vital Signs BP 117/72   Pulse 80   Temp 97.7 F (36.5 C)   Resp 16  Ht 1.676 m (5\' 6" )   Wt 78.5 kg   SpO2 93%   BMI 27.92 kg/m  Physical Exam Vitals and nursing note reviewed.  Constitutional:      General: She is not in acute distress.    Appearance: She is well-developed.  HENT:     Head: Normocephalic and atraumatic.     Mouth/Throat:     Pharynx: No oropharyngeal exudate.  Eyes:     General: No scleral icterus.       Right eye: No discharge.        Left eye: No discharge.     Conjunctiva/sclera: Conjunctivae normal.     Pupils: Pupils are equal, round, and reactive to light.  Neck:     Thyroid: No thyromegaly.     Vascular: No JVD.  Cardiovascular:     Rate and Rhythm: Normal rate and regular rhythm.     Heart sounds: Normal heart sounds. No murmur  heard.    No friction rub. No gallop.  Pulmonary:     Effort: Pulmonary effort is normal. No respiratory distress.     Breath sounds: Normal breath sounds. No wheezing or rales.  Abdominal:     General: Bowel sounds are normal. There is no distension.     Palpations: Abdomen is soft. There is no mass.     Tenderness: There is no abdominal tenderness.  Musculoskeletal:        General: No tenderness. Normal range of motion.     Cervical back: Normal range of motion and neck supple.  Lymphadenopathy:     Cervical: No cervical adenopathy.  Skin:    General: Skin is warm and dry.     Findings: Rash present. No erythema.     Comments: Abscess present to the left suprapubic region as well as the right medial thigh  Neurological:     Mental Status: She is alert.     Coordination: Coordination normal.  Psychiatric:        Behavior: Behavior normal.     ED Results / Procedures / Treatments   Labs (all labs ordered are listed, but only abnormal results are displayed) Labs Reviewed  CBC WITH DIFFERENTIAL/PLATELET - Abnormal; Notable for the following components:      Result Value   WBC 12.1 (*)    RBC 5.74 (*)    Hemoglobin 15.6 (*)    HCT 47.4 (*)    All other components within normal limits  COMPREHENSIVE METABOLIC PANEL - Abnormal; Notable for the following components:   Sodium 132 (*)    CO2 20 (*)    Glucose, Bld 356 (*)    Calcium 8.2 (*)    Albumin 3.3 (*)    All other components within normal limits  CBG MONITORING, ED - Abnormal; Notable for the following components:   Glucose-Capillary 279 (*)    All other components within normal limits    EKG None  Radiology No results found.  Procedures INCISION AND DRAINAGE  Date/Time: 11/24/2022 9:35 PM  Performed by: Eber Hong, MD Authorized by: Eber Hong, MD   Consent:    Consent obtained:  Verbal   Consent given by:  Patient   Risks discussed:  Bleeding, damage to other organs, incomplete drainage,  infection and pain   Alternatives discussed:  Delayed treatment Universal protocol:    Procedure explained and questions answered to patient or proxy's satisfaction: yes     Relevant documents present and verified: yes     Immediately prior to  procedure, a time out was called: yes     Patient identity confirmed:  Verbally with patient Location:    Type:  Abscess   Location:  Anogenital   Anogenital location: Prepubic region as well as the right inguinal region. Pre-procedure details:    Skin preparation:  Betadine Sedation:    Sedation type:  None Anesthesia:    Anesthesia method:  Local infiltration   Local anesthetic:  Lidocaine 1% WITH epi Procedure type:    Complexity:  Complex Procedure details:    Ultrasound guidance: no     Needle aspiration: no     Incision types:  Single straight   Incision depth:  Submucosal   Wound management:  Probed and deloculated, irrigated with saline and extensive cleaning   Drainage:  Purulent   Drainage amount:  Moderate   Wound treatment:  Wound left open Post-procedure details:    Procedure completion:  Tolerated well, no immediate complications Comments:     Probed and deloculated with a sterile Q-tip, irrigated, dressed with a sterile dressing, small to moderate amount of purulent material     Medications Ordered in ED Medications  vancomycin (VANCOREADY) IVPB 1500 mg/300 mL (1,500 mg Intravenous New Bag/Given 11/24/22 1945)  lidocaine-EPINEPHrine (XYLOCAINE W/EPI) 2 %-1:200000 (PF) injection (has no administration in time range)  sodium chloride 0.9 % bolus 1,000 mL (0 mLs Intravenous Stopped 11/24/22 2047)  insulin aspart (novoLOG) injection 10 Units (10 Units Subcutaneous Given 11/24/22 1940)    ED Course/ Medical Decision Making/ A&P                             Medical Decision Making Risk Prescription drug management.    This patient presents to the ED for concern of rash and hyperglycemia, this involves an extensive  number of treatment options, and is a complaint that carries with it a high risk of complications and morbidity.  The differential diagnosis includes recurrent abscesses, consistent with recurrent MRSA   Co morbidities that complicate the patient evaluation  Untreated diabetes   Additional history obtained:  Additional history obtained from medical record External records from outside source obtained and reviewed including multiple ED visits for hyperglycemia, no recent admissions to the hospital   Lab Tests:  I Ordered, and personally interpreted labs.  The pertinent results include: CBC with a cytosis of 12,000, metabolic panel shows hyperglycemia with a CO2 of 20 and no increased anion gap, blood sugar of 356, no DKA   Cardiac Monitoring: / EKG:  The patient was maintained on a cardiac monitor.  I personally viewed and interpreted the cardiac monitored which showed an underlying rhythm of: Sinus rhythm    Problem List / ED Course / Critical interventions / Medication management  Currently, patient appears well abscess drained I ordered medication including vancomycin for infection Reevaluation of the patient after these medicines showed that the patient improved I have reviewed the patients home medicines and have made adjustments as needed   Social Determinants of Health:  Untreated diabetic, she will be given prescriptions for her medications including doxycycline and insulin   Test / Admission - Considered:  Considered admission but the patient is well-appearing not tachycardic not febrile and agreeable to discharge, no signs of DKA on labs         Final Clinical Impression(s) / ED Diagnoses Final diagnoses:  Hyperglycemia  Cutaneous abscess, unspecified site    Rx / DC Orders ED Discharge Orders  Ordered    Insulin Glargine [facility]) 100 UNIT/ML  Daily        11/24/22 2137    doxycycline (VIBRAMYCIN) 100 MG capsule  2 times daily         11/24/22 2137              Eber Hong, MD 11/24/22 2138

## 2022-11-24 NOTE — ED Triage Notes (Signed)
Pt comes in with complaints of abscesses. Pt states abscess on butt, rt groin, and lt groin. Pt states draining them at home but they just keep getting bigger.

## 2022-11-24 NOTE — Discharge Instructions (Addendum)
Please take your medications exactly as prescribed including the insulin and the doxycycline, you will need to see your family doctor for a follow-up visit within 3 days for a recheck.  ER for severe worsening pain spreading redness or fever

## 2023-07-20 ENCOUNTER — Encounter (HOSPITAL_BASED_OUTPATIENT_CLINIC_OR_DEPARTMENT_OTHER): Payer: Self-pay | Admitting: Emergency Medicine

## 2023-07-20 ENCOUNTER — Other Ambulatory Visit: Payer: Self-pay

## 2023-07-20 ENCOUNTER — Inpatient Hospital Stay (HOSPITAL_COMMUNITY)

## 2023-07-20 ENCOUNTER — Emergency Department (HOSPITAL_BASED_OUTPATIENT_CLINIC_OR_DEPARTMENT_OTHER)

## 2023-07-20 ENCOUNTER — Emergency Department (HOSPITAL_BASED_OUTPATIENT_CLINIC_OR_DEPARTMENT_OTHER): Admitting: Radiology

## 2023-07-20 ENCOUNTER — Inpatient Hospital Stay (HOSPITAL_BASED_OUTPATIENT_CLINIC_OR_DEPARTMENT_OTHER)
Admission: EM | Admit: 2023-07-20 | Discharge: 2023-07-24 | DRG: 871 | Disposition: A | Discharge status: Home / Self Care | Attending: Internal Medicine | Admitting: Internal Medicine

## 2023-07-20 DIAGNOSIS — K22711 Barrett's esophagus with high grade dysplasia: Secondary | ICD-10-CM | POA: Diagnosis present

## 2023-07-20 DIAGNOSIS — Z7985 Long-term (current) use of injectable non-insulin antidiabetic drugs: Secondary | ICD-10-CM | POA: Diagnosis not present

## 2023-07-20 DIAGNOSIS — Z1152 Encounter for screening for COVID-19: Secondary | ICD-10-CM

## 2023-07-20 DIAGNOSIS — E119 Type 2 diabetes mellitus without complications: Secondary | ICD-10-CM | POA: Diagnosis present

## 2023-07-20 DIAGNOSIS — J121 Respiratory syncytial virus pneumonia: Secondary | ICD-10-CM | POA: Diagnosis present

## 2023-07-20 DIAGNOSIS — Z79899 Other long term (current) drug therapy: Secondary | ICD-10-CM

## 2023-07-20 DIAGNOSIS — R531 Weakness: Secondary | ICD-10-CM | POA: Diagnosis present

## 2023-07-20 DIAGNOSIS — I10 Essential (primary) hypertension: Secondary | ICD-10-CM | POA: Diagnosis present

## 2023-07-20 DIAGNOSIS — J189 Pneumonia, unspecified organism: Principal | ICD-10-CM | POA: Diagnosis present

## 2023-07-20 DIAGNOSIS — R008 Other abnormalities of heart beat: Secondary | ICD-10-CM | POA: Diagnosis present

## 2023-07-20 DIAGNOSIS — Z833 Family history of diabetes mellitus: Secondary | ICD-10-CM

## 2023-07-20 DIAGNOSIS — Z5971 Insufficient health insurance coverage: Secondary | ICD-10-CM | POA: Diagnosis not present

## 2023-07-20 DIAGNOSIS — A419 Sepsis, unspecified organism: Principal | ICD-10-CM | POA: Diagnosis present

## 2023-07-20 DIAGNOSIS — Z813 Family history of other psychoactive substance abuse and dependence: Secondary | ICD-10-CM

## 2023-07-20 DIAGNOSIS — R200 Anesthesia of skin: Secondary | ICD-10-CM | POA: Diagnosis present

## 2023-07-20 DIAGNOSIS — K219 Gastro-esophageal reflux disease without esophagitis: Secondary | ICD-10-CM | POA: Diagnosis present

## 2023-07-20 DIAGNOSIS — G8929 Other chronic pain: Secondary | ICD-10-CM | POA: Diagnosis present

## 2023-07-20 DIAGNOSIS — Z880 Allergy status to penicillin: Secondary | ICD-10-CM | POA: Diagnosis not present

## 2023-07-20 DIAGNOSIS — F17200 Nicotine dependence, unspecified, uncomplicated: Secondary | ICD-10-CM | POA: Diagnosis present

## 2023-07-20 DIAGNOSIS — Z90711 Acquired absence of uterus with remaining cervical stump: Secondary | ICD-10-CM | POA: Diagnosis not present

## 2023-07-20 DIAGNOSIS — Z818 Family history of other mental and behavioral disorders: Secondary | ICD-10-CM | POA: Diagnosis not present

## 2023-07-20 DIAGNOSIS — M545 Low back pain, unspecified: Secondary | ICD-10-CM | POA: Diagnosis present

## 2023-07-20 DIAGNOSIS — R202 Paresthesia of skin: Secondary | ICD-10-CM | POA: Diagnosis present

## 2023-07-20 DIAGNOSIS — I498 Other specified cardiac arrhythmias: Secondary | ICD-10-CM | POA: Diagnosis present

## 2023-07-20 DIAGNOSIS — F411 Generalized anxiety disorder: Secondary | ICD-10-CM | POA: Diagnosis present

## 2023-07-20 DIAGNOSIS — J440 Chronic obstructive pulmonary disease with acute lower respiratory infection: Secondary | ICD-10-CM | POA: Diagnosis present

## 2023-07-20 LAB — CBC WITH DIFFERENTIAL/PLATELET
Abs Immature Granulocytes: 0.08 10*3/uL — ABNORMAL HIGH (ref 0.00–0.07)
Basophils Absolute: 0.1 10*3/uL (ref 0.0–0.1)
Basophils Relative: 0 %
Eosinophils Absolute: 0.2 10*3/uL (ref 0.0–0.5)
Eosinophils Relative: 1 %
HCT: 47.8 % — ABNORMAL HIGH (ref 36.0–46.0)
Hemoglobin: 16.2 g/dL — ABNORMAL HIGH (ref 12.0–15.0)
Immature Granulocytes: 1 %
Lymphocytes Relative: 22 %
Lymphs Abs: 3.2 10*3/uL (ref 0.7–4.0)
MCH: 27.7 pg (ref 26.0–34.0)
MCHC: 33.9 g/dL (ref 30.0–36.0)
MCV: 81.7 fL (ref 80.0–100.0)
Monocytes Absolute: 0.8 10*3/uL (ref 0.1–1.0)
Monocytes Relative: 6 %
Neutro Abs: 9.9 10*3/uL — ABNORMAL HIGH (ref 1.7–7.7)
Neutrophils Relative %: 70 %
Platelets: 246 10*3/uL (ref 150–400)
RBC: 5.85 MIL/uL — ABNORMAL HIGH (ref 3.87–5.11)
RDW: 12.3 % (ref 11.5–15.5)
WBC: 14.2 10*3/uL — ABNORMAL HIGH (ref 4.0–10.5)
nRBC: 0 % (ref 0.0–0.2)

## 2023-07-20 LAB — COMPREHENSIVE METABOLIC PANEL
ALT: 13 U/L (ref 0–44)
AST: 11 U/L — ABNORMAL LOW (ref 15–41)
Albumin: 3.7 g/dL (ref 3.5–5.0)
Alkaline Phosphatase: 103 U/L (ref 38–126)
Anion gap: 10 (ref 5–15)
BUN: 8 mg/dL (ref 6–20)
CO2: 28 mmol/L (ref 22–32)
Calcium: 9.1 mg/dL (ref 8.9–10.3)
Chloride: 97 mmol/L — ABNORMAL LOW (ref 98–111)
Creatinine, Ser: 0.43 mg/dL — ABNORMAL LOW (ref 0.44–1.00)
GFR, Estimated: >60 mL/min (ref >60)
Glucose, Bld: 439 mg/dL — ABNORMAL HIGH (ref 70–99)
Potassium: 3.7 mmol/L (ref 3.5–5.1)
Sodium: 135 mmol/L (ref 135–145)
Total Bilirubin: 0.4 mg/dL (ref 0.0–1.2)
Total Protein: 7.1 g/dL (ref 6.5–8.1)

## 2023-07-20 LAB — CBC
HCT: 45.9 % (ref 36.0–46.0)
Hemoglobin: 15.5 g/dL — ABNORMAL HIGH (ref 12.0–15.0)
MCH: 27.8 pg (ref 26.0–34.0)
MCHC: 33.8 g/dL (ref 30.0–36.0)
MCV: 82.3 fL (ref 80.0–100.0)
Platelets: 224 10*3/uL (ref 150–400)
RBC: 5.58 MIL/uL — ABNORMAL HIGH (ref 3.87–5.11)
RDW: 12.4 % (ref 11.5–15.5)
WBC: 17 10*3/uL — ABNORMAL HIGH (ref 4.0–10.5)
nRBC: 0 % (ref 0.0–0.2)

## 2023-07-20 LAB — D-DIMER, QUANTITATIVE: D-Dimer, Quant: 0.45 ug{FEU}/mL (ref 0.00–0.50)

## 2023-07-20 LAB — GLUCOSE, CAPILLARY
Glucose-Capillary: 289 mg/dL — ABNORMAL HIGH (ref 70–99)
Glucose-Capillary: 216 mg/dL — ABNORMAL HIGH (ref 70–99)

## 2023-07-20 LAB — RESP PANEL BY RT-PCR (RSV, FLU A&B, COVID)  RVPGX2
Influenza A by PCR: NEGATIVE
Influenza B by PCR: NEGATIVE
Resp Syncytial Virus by PCR: POSITIVE — AB
SARS Coronavirus 2 by RT PCR: NEGATIVE

## 2023-07-20 LAB — LACTIC ACID, PLASMA
Lactic Acid, Venous: 1.6 mmol/L (ref 0.5–1.9)
Lactic Acid, Venous: 1.8 mmol/L (ref 0.5–1.9)

## 2023-07-20 LAB — CREATININE, SERUM
Creatinine, Ser: 0.38 mg/dL — ABNORMAL LOW (ref 0.44–1.00)
GFR, Estimated: >60 mL/min (ref >60)

## 2023-07-20 LAB — HIV ANTIBODY (ROUTINE TESTING W REFLEX): HIV Screen 4th Generation wRfx: NONREACTIVE

## 2023-07-20 LAB — CK: Total CK: 42 U/L (ref 38–234)

## 2023-07-20 MED ORDER — SODIUM CHLORIDE 0.9 % IV SOLN
500.0000 mg | INTRAVENOUS | Status: DC
  Administered 2023-07-21 – 2023-07-22 (×2): 500 mg via INTRAVENOUS
  Filled 2023-07-20 (×2): qty 5

## 2023-07-20 MED ORDER — LACTATED RINGERS IV SOLN: Freq: Once | INTRAVENOUS | Status: AC

## 2023-07-20 MED ORDER — SODIUM CHLORIDE 0.9% FLUSH: 3.0000 mL | INTRAVENOUS | Status: DC | PRN

## 2023-07-20 MED ORDER — ACETAMINOPHEN 650 MG RE SUPP: 650.0000 mg | Freq: Four times a day (QID) | RECTAL | Status: DC | PRN

## 2023-07-20 MED ORDER — MORPHINE SULFATE (PF) 2 MG/ML IV SOLN: 2.0000 mg | INTRAVENOUS | Status: DC | PRN

## 2023-07-20 MED ORDER — SERTRALINE HCL 100 MG PO TABS
100.0000 mg | ORAL_TABLET | Freq: Every day | ORAL | Status: DC
  Administered 2023-07-21 – 2023-07-24 (×4): 100 mg via ORAL
  Filled 2023-07-20 (×4): qty 1

## 2023-07-20 MED ORDER — SODIUM CHLORIDE 0.9% FLUSH
3.0000 mL | Freq: Two times a day (BID) | INTRAVENOUS | Status: DC
Start: 2023-07-20 — End: 2023-07-24
  Administered 2023-07-20 – 2023-07-24 (×8): 3 mL via INTRAVENOUS

## 2023-07-20 MED ORDER — INSULIN ASPART 100 UNIT/ML IJ SOLN
0.0000 [IU] | Freq: Three times a day (TID) | INTRAMUSCULAR | Status: DC
  Administered 2023-07-20: 8 [IU] via SUBCUTANEOUS
  Administered 2023-07-21: 11 [IU] via SUBCUTANEOUS
  Administered 2023-07-21 – 2023-07-22 (×3): 5 [IU] via SUBCUTANEOUS
  Administered 2023-07-22: 2 [IU] via SUBCUTANEOUS
  Administered 2023-07-22: 5 [IU] via SUBCUTANEOUS
  Administered 2023-07-23 (×2): 3 [IU] via SUBCUTANEOUS
  Administered 2023-07-23: 5 [IU] via SUBCUTANEOUS
  Administered 2023-07-24: 3 [IU] via SUBCUTANEOUS

## 2023-07-20 MED ORDER — ATORVASTATIN CALCIUM 10 MG PO TABS
20.0000 mg | ORAL_TABLET | Freq: Every day | ORAL | Status: DC
  Administered 2023-07-20 – 2023-07-23 (×4): 20 mg via ORAL
  Filled 2023-07-20 (×4): qty 2

## 2023-07-20 MED ORDER — HYDROCODONE-ACETAMINOPHEN 5-325 MG PO TABS
1.0000 | ORAL_TABLET | ORAL | Status: DC | PRN
  Administered 2023-07-20 – 2023-07-23 (×7): 1 via ORAL
  Filled 2023-07-20 (×8): qty 1

## 2023-07-20 MED ORDER — LACTATED RINGERS IV SOLN: INTRAVENOUS | Status: DC

## 2023-07-20 MED ORDER — BUDESONIDE 0.25 MG/2ML IN SUSP
0.2500 mg | Freq: Two times a day (BID) | RESPIRATORY_TRACT | Status: DC
Start: 2023-07-20 — End: 2023-07-24
  Administered 2023-07-20 – 2023-07-24 (×8): 0.25 mg via RESPIRATORY_TRACT
  Filled 2023-07-20 (×8): qty 2

## 2023-07-20 MED ORDER — SODIUM CHLORIDE 0.9 % IV SOLN
500.0000 mg | Freq: Once | INTRAVENOUS | Status: AC
  Administered 2023-07-20: 500 mg via INTRAVENOUS
  Filled 2023-07-20: qty 5

## 2023-07-20 MED ORDER — NICOTINE 21 MG/24HR TD PT24
21.0000 mg | MEDICATED_PATCH | Freq: Every day | TRANSDERMAL | Status: DC
  Administered 2023-07-20 – 2023-07-24 (×5): 21 mg via TRANSDERMAL
  Filled 2023-07-20 (×5): qty 1

## 2023-07-20 MED ORDER — ALBUTEROL SULFATE (2.5 MG/3ML) 0.083% IN NEBU: 2.5000 mg | INHALATION_SOLUTION | Freq: Three times a day (TID) | RESPIRATORY_TRACT | Status: DC

## 2023-07-20 MED ORDER — SODIUM CHLORIDE 0.9 % IV SOLN: 250.0000 mL | INTRAVENOUS | Status: AC | PRN

## 2023-07-20 MED ORDER — PANTOPRAZOLE SODIUM 40 MG IV SOLR
40.0000 mg | Freq: Two times a day (BID) | INTRAVENOUS | Status: DC
  Administered 2023-07-20 – 2023-07-22 (×4): 40 mg via INTRAVENOUS
  Filled 2023-07-20 (×4): qty 10

## 2023-07-20 MED ORDER — ACETAMINOPHEN 325 MG PO TABS: 650.0000 mg | ORAL_TABLET | Freq: Four times a day (QID) | ORAL | Status: DC | PRN

## 2023-07-20 MED ORDER — ALBUTEROL SULFATE HFA 108 (90 BASE) MCG/ACT IN AERS
1.0000 | INHALATION_SPRAY | RESPIRATORY_TRACT | Status: DC | PRN
Start: 2023-07-20 — End: 2023-07-20

## 2023-07-20 MED ORDER — HEPARIN SODIUM (PORCINE) 5000 UNIT/ML IJ SOLN
5000.0000 [IU] | Freq: Three times a day (TID) | INTRAMUSCULAR | Status: DC
Start: 2023-07-20 — End: 2023-07-24
  Administered 2023-07-20 – 2023-07-24 (×11): 5000 [IU] via SUBCUTANEOUS
  Filled 2023-07-20 (×10): qty 1

## 2023-07-20 MED ORDER — SODIUM CHLORIDE 0.9 % IV SOLN
2.0000 g | INTRAVENOUS | Status: DC
  Administered 2023-07-20 – 2023-07-23 (×4): 2 g via INTRAVENOUS
  Filled 2023-07-20 (×4): qty 20

## 2023-07-20 MED ORDER — SODIUM CHLORIDE 0.9% FLUSH
3.0000 mL | Freq: Two times a day (BID) | INTRAVENOUS | Status: DC
Start: 2023-07-20 — End: 2023-07-24
  Administered 2023-07-20 – 2023-07-22 (×2): 3 mL via INTRAVENOUS

## 2023-07-20 MED ORDER — SODIUM CHLORIDE 0.9 % IV SOLN
1.0000 g | Freq: Once | INTRAVENOUS | Status: AC
  Administered 2023-07-20: 1 g via INTRAVENOUS
  Filled 2023-07-20: qty 10

## 2023-07-20 MED ORDER — LACTATED RINGERS IV BOLUS
1000.0000 mL | Freq: Once | INTRAVENOUS | Status: AC
  Administered 2023-07-20: 1000 mL via INTRAVENOUS

## 2023-07-20 MED ORDER — GUAIFENESIN-DM 100-10 MG/5ML PO SYRP
5.0000 mL | ORAL_SOLUTION | ORAL | Status: DC | PRN
Start: 2023-07-20 — End: 2023-07-24
  Administered 2023-07-20 – 2023-07-23 (×3): 5 mL via ORAL
  Filled 2023-07-20 (×4): qty 5

## 2023-07-20 MED ORDER — ALBUTEROL SULFATE (2.5 MG/3ML) 0.083% IN NEBU
2.5000 mg | INHALATION_SOLUTION | Freq: Three times a day (TID) | RESPIRATORY_TRACT | Status: DC
  Administered 2023-07-20 – 2023-07-21 (×2): 2.5 mg via RESPIRATORY_TRACT
  Filled 2023-07-20 (×2): qty 3

## 2023-07-20 MED ORDER — ALBUTEROL SULFATE (2.5 MG/3ML) 0.083% IN NEBU: 2.5000 mg | INHALATION_SOLUTION | RESPIRATORY_TRACT | Status: DC | PRN

## 2023-07-20 NOTE — Assessment & Plan Note (Signed)
 A1c/ glycemic protocol.

## 2023-07-20 NOTE — ED Triage Notes (Signed)
 Pt reports left lower back pain and left leg numbness. Pt reports this started on Wednesday 3/12. Pt also reports cough and congestion x 3 weeks. Denies fevers.

## 2023-07-20 NOTE — Plan of Care (Signed)
   Problem: Education: Goal: Knowledge of General Education information will improve Description Including pain rating scale, medication(s)/side effects and non-pharmacologic comfort measures Outcome: Progressing   Problem: Health Behavior/Discharge Planning: Goal: Ability to manage health-related needs will improve Outcome: Progressing

## 2023-07-20 NOTE — Progress Notes (Signed)
 Patient admits not proactively treating her diabetes recently.  States that she lost her insurance but has just started a new job.  Diabetes consult placed for education and assistance.

## 2023-07-20 NOTE — ED Provider Notes (Signed)
 [place] EMERGENCY DEPARTMENT AT [facility] Provider Note   CSN: [id number] Arrival date & time: 07/20/23  1125     History  Chief Complaint  Patient presents with   Cough   Back Pain    Katherine Blair is a 58 y.o. female.   Cough Back Pain Associated symptoms: numbness      58 year old female with medical history significant for COPD, diabetes mellitus, HTN, GERD presenting to the emergency department with multiple complaints.  The patient states that for the past 3 weeks she has had a cough productive of sputum with associated congestion.  She states that she is coughing up a significant mount of phlegm and yellow sputum.  She denies any chest discomfort.  She denies any fevers or chills.  Additionally, on Wednesday 3/12 she was at work and bending over and subsequently noted that her leg felt like it was asleep.  She has had persistent numbness in her left leg ever since.  Unclear if she is having weakness as well.  She feels she might be having slight weakness in her leg.  No known history of stroke.  She endorses mild low back pain.  Denies any saddle anesthesia.  No bilateral numbness or weakness, no falls or trauma.   Home Medications Prior to Admission medications   Medication Sig Start Date End Date Taking? Authorizing Provider  atorvastatin (LIPITOR) 20 MG tablet Take 20 mg by mouth at bedtime.    [provider]  dicyclomine (BENTYL) 20 MG tablet Take 1 tablet (20 mg total) by mouth 4 (four) times daily as needed for spasms. 12/04/19   Molpus, John, MD  doxycycline (VIBRAMYCIN) 100 MG capsule Take 1 capsule (100 mg total) by mouth 2 (two) times daily. 11/24/22   Eber Hong, MD  Dulaglutide (TRULICITY) 4.5 MG/0.5ML SOPN Inject 4.5 mg into the skin once a week. 03/16/20   Romero Belling, MD  GABAPENTIN PO Take by mouth.    [provider]  HYDROcodone-acetaminophen (NORCO/VICODIN) 5-325 MG tablet Take 2 tablets by mouth every 6 (six) hours as needed  for severe pain. 12/25/21   Karie Mainland, Amjad, PA-C  hydrOXYzine (ATARAX) 10 MG tablet TAKE 1 TABLET BY MOUTH 3 TIMES DAILY AS NEEDED FOR ANXIETY. 03/05/22   Stasia Cavalier, MD  Insulin Glargine [facility]) 100 UNIT/ML Inject 130 Units into the skin daily. 11/24/22   Eber Hong, MD  Insulin Pen Needle (PEN NEEDLES) 32G X 5 MM MISC 1 Device by Does not apply route daily. 09/24/19   Romero Belling, MD  lidocaine (LIDODERM) 5 % Place 1 patch onto the skin daily. Remove & Discard patch within 12 hours or as directed by MD 12/25/21   Marita Kansas, PA-C  methocarbamol (ROBAXIN) 500 MG tablet Take 1 tablet (500 mg total) by mouth 2 (two) times daily. 12/25/21   Marita Kansas, PA-C  naproxen (NAPROSYN) 375 MG tablet Take 1 tablet (375 mg total) by mouth 2 (two) times daily. 12/25/21   Marita Kansas, PA-C  omeprazole (PRILOSEC) 20 MG capsule Take 20 mg by mouth daily.    [provider]  ondansetron (ZOFRAN ODT) 8 MG disintegrating tablet Take 1 tablet (8 mg total) by mouth every 8 (eight) hours as needed for nausea or vomiting. 12/04/19   Molpus, John, MD  PHENTERMINE HCL PO Take by mouth.    [provider]  sertraline (ZOLOFT) 100 MG tablet TAKE 1 TABLET BY MOUTH EVERY DAY 03/05/22   Stasia Cavalier, MD  Allergies    Penicillins    Review of Systems   Review of Systems  Respiratory:  Positive for cough.   Musculoskeletal:  Positive for back pain.  Neurological:  Positive for numbness.  All other systems reviewed and are negative.   Physical Exam Updated Vital Signs BP 129/85   Pulse (!) 50   Temp 98.5 F (36.9 C) (Oral)   Resp 17   Ht 5\' 6"  (1.676 m)   Wt 67.1 kg   SpO2 96%   BMI 23.89 kg/m  Physical Exam Vitals and nursing note reviewed.  Constitutional:      General: She is not in acute distress.    Appearance: She is well-developed.  HENT:     Head: Normocephalic and atraumatic.  Eyes:     Conjunctiva/sclera: Conjunctivae normal.  Cardiovascular:     Rate and Rhythm:  Normal rate and regular rhythm.  Pulmonary:     Effort: Pulmonary effort is normal. No respiratory distress.     Breath sounds: Rhonchi present.  Abdominal:     Palpations: Abdomen is soft.     Tenderness: There is no abdominal tenderness.  Musculoskeletal:        General: No swelling.     Cervical back: Neck supple.     Comments: No midline tenderness of the thoracic or lumbar spine, negative straight leg raise test  Skin:    General: Skin is warm and dry.     Capillary Refill: Capillary refill takes less than 2 seconds.  Neurological:     Mental Status: She is alert.     Comments: MENTAL STATUS EXAM:    Orientation: Alert and oriented to person, place and time.  Memory: Cooperative, follows commands well.  Language: Speech is clear and language is normal.   CRANIAL NERVES:    CN 2 (Optic): Visual fields intact to confrontation.  CN 3,4,6 (EOM): Pupils equal and reactive to light. Full extraocular eye movement without nystagmus.  CN 5 (Trigeminal): Facial sensation is normal, no weakness of masticatory muscles.  CN 7 (Facial): No facial weakness or asymmetry.  CN 8 (Auditory): Auditory acuity grossly normal.  CN 9,10 (Glossophar): The uvula is midline, the palate elevates symmetrically.  CN 11 (spinal access): Normal sternocleidomastoid and trapezius strength.  CN 12 (Hypoglossal): The tongue is midline. No atrophy or fasciculations.Marland Kitchen   MOTOR:  Muscle Strength: 5/5RUE, 5/5LUE, 5/5RLE, 4/5LLE  REFLEXES: No clonus.   COORDINATION:   No tremor.   SENSATION:   Intact to light touch all four extremities, DIMINISHED IN THE LEFT LEG TO LIGHT TOUCH.     Psychiatric:        Mood and Affect: Mood normal.     ED Results / Procedures / Treatments   Labs (all labs ordered are listed, but only abnormal results are displayed) Labs Reviewed  CBC WITH DIFFERENTIAL/PLATELET - Abnormal; Notable for the following components:      Result Value   WBC 14.2 (*)    RBC 5.85 (*)     Hemoglobin 16.2 (*)    HCT 47.8 (*)    Neutro Abs 9.9 (*)    Abs Immature Granulocytes 0.08 (*)    All other components within normal limits  COMPREHENSIVE METABOLIC PANEL - Abnormal; Notable for the following components:   Chloride 97 (*)    Glucose, Bld 439 (*)    Creatinine, Ser 0.43 (*)    AST 11 (*)    All other components within normal limits  RESP PANEL BY RT-PCR (  RSV, FLU A&B, COVID)  RVPGX2  CULTURE, BLOOD (ROUTINE X 2)  CULTURE, BLOOD (ROUTINE X 2)  LACTIC ACID, PLASMA  LACTIC ACID, PLASMA    EKG EKG Interpretation Date/Time:  Saturday July 20 2023 11:37:08 EDT Ventricular Rate:  125 PR Interval:  136 QRS Duration:  88 QT Interval:  359 QTC Calculation: 420 R Axis:   89  Text Interpretation: Sinus tachycardia Ventricular bigeminy Prominent P waves, nondiagnostic Borderline repolarization abnormality Confirmed by Ernie Avena ([id number]) on 07/20/2023 11:46:36 AM  Radiology CT Head Wo Contrast Result Date: 07/20/2023 CLINICAL DATA:  Neuro deficit, concern for stroke. EXAM: CT HEAD WITHOUT CONTRAST TECHNIQUE: Contiguous axial images were obtained from the base of the skull through the vertex without intravenous contrast. RADIATION DOSE REDUCTION: This exam was performed according to the departmental dose-optimization program which includes automated exposure control, adjustment of the mA and/or kV according to patient size and/or use of iterative reconstruction technique. COMPARISON:  None Available. FINDINGS: Brain: No acute intracranial hemorrhage. No CT evidence of acute infarct. No edema, mass effect, or midline shift. The basilar cisterns are patent. Ventricles: The ventricles are normal. Vascular: No hyperdense vessel or unexpected calcification. Skull: No acute or aggressive finding. Orbits: Orbits are symmetric. Sinuses: Mucosal thickening involving the left frontal sinus outflow tract and left anterior ethmoid air cells. Other: Mastoid air cells are clear. IMPRESSION: No  CT evidence of acute intracranial abnormality. Electronically Signed   By: Emily Filbert M.D.   On: 07/20/2023 14:09   DG Chest 2 View Result Date: 07/20/2023 CLINICAL DATA:  Cough and congestion. EXAM: CHEST - 2 VIEW COMPARISON:  02/16/2019 and CT chest 11/16/2011. FINDINGS: Trachea is midline. Heart size normal. Right middle lobe airspace consolidation. No pleural fluid. IMPRESSION: Right middle lobe pneumonia. Followup PA and lateral chest X-ray is recommended in 3-4 weeks following trial of antibiotic therapy to ensure resolution and exclude underlying malignancy. Electronically Signed   By: Leanna Battles M.D.   On: 07/20/2023 12:20    Procedures .Critical Care  Performed by: Ernie Avena, MD Authorized by: Ernie Avena, MD   Critical care provider statement:    Critical care time (minutes):  30   Critical care was necessary to treat or prevent imminent or life-threatening deterioration of the following conditions:  Sepsis   Critical care was time spent personally by me on the following activities:  Development of treatment plan with patient or surrogate, discussions with consultants, evaluation of patient's response to treatment, examination of patient, ordering and review of laboratory studies, ordering and review of radiographic studies, ordering and performing treatments and interventions, pulse oximetry, re-evaluation of patient's condition and review of old charts   Care discussed with: admitting provider       Medications Ordered in ED Medications  lactated ringers infusion ( Intravenous New Bag/Given 07/20/23 1409)  lactated ringers bolus 1,000 mL (1,000 mLs Intravenous New Bag/Given 07/20/23 1218)  cefTRIAXone (ROCEPHIN) 1 g in sodium chloride 0.9 % 100 mL IVPB (0 g Intravenous Stopped 07/20/23 1330)  azithromycin (ZITHROMAX) 500 mg in sodium chloride 0.9 % 250 mL IVPB (500 mg Intravenous New Bag/Given 07/20/23 1333)    ED Course/ Medical Decision Making/ A&P                                  Medical Decision Making Amount and/or Complexity of Data Reviewed Labs: ordered. Radiology: ordered.  Risk Prescription drug management. Decision regarding hospitalization.  58 year old female with medical history significant for COPD, diabetes mellitus, HTN, GERD presenting to the emergency department with multiple complaints.  The patient states that for the past 3 weeks she has had a cough productive of sputum with associated congestion.  She states that she is coughing up a significant mount of phlegm and yellow sputum.  She denies any chest discomfort.  She denies any fevers or chills.  Additionally, on Wednesday 3/12 she was at work and bending over and subsequently noted that her leg felt like it was asleep.  She has had persistent numbness in her left leg ever since.  Unclear if she is having weakness as well.  She feels she might be having slight weakness in her leg.  No known history of stroke.  She endorses mild low back pain.  Denies any saddle anesthesia.  No bilateral numbness or weakness, no falls or trauma.   On arrival, the patient was afebrile, tachycardic heart rate 111, hemodynamically stable, saturating well on room air.  Sinus tachycardia noted on cardiac telemetry.  Physical exam revealed left lower extremity weakness and numbness which has been ongoing for at least the last week according to the patient.  Patient endorses productive cough.  Differential diagnosis includes pneumonia, viral infection, bronchitis, COPD exacerbation.  Additionally considered CVA, spinal stenosis, degenerative disc disease resulting in myelopathy.  Low concern for cauda equina syndrome based on history and exam.  IV access was obtained and the patient was administered an IV fluid bolus.  A chest x-ray was performed which revealed evidence of a right middle lobe pneumonia.  Laboratory evaluation revealed a leukocytosis to 14.2 and hemoglobin of 16.2 likely evidence of  hemoconcentration.  The patient has not been on a steroid recently.  She presents with a CMP with hyperglycemia to 439, no anion gap acidosis with a bicarbonate of 28, anion gap of 10, initial lactic acid 1.6.  Code sepsis was initiated due to the finding of pneumonia and meeting SIRS criteria with a tachycardia and leukocytosis. A CT head was obtained without acute intracranial abnormality.  Patient would benefit from MRI imaging potentially of the brain and spine inpatient.  Patient was administered CAP coverage with Rocephin and azithromycin.  Given the concern for sepsis from community-acquired pneumonia, hospitalist medicine was consulted for admission, Dr. Zenaida Niece accepting.   Final Clinical Impression(s) / ED Diagnoses Final diagnoses:  Community acquired pneumonia of right middle lobe of lung  Sepsis, due to unspecified organism, unspecified whether acute organ dysfunction present (HCC)  Weakness    Rx / DC Orders ED Discharge Orders     None         Ernie Avena, MD 07/20/23 1437

## 2023-07-20 NOTE — ED Notes (Signed)
 Spoke with Casimiro Needle on 3 east to leave number for Prince Solian RN doing pt care.

## 2023-07-20 NOTE — ED Notes (Signed)
 Unable to obtain 2nd blood culture,unsuccessful x 3.

## 2023-07-20 NOTE — Assessment & Plan Note (Signed)
 IV PPI q12h. Pt needs GI f/u.

## 2023-07-20 NOTE — ED Notes (Signed)
 Called Kimberly at [facility] for transport 14:04

## 2023-07-20 NOTE — Assessment & Plan Note (Addendum)
 Pt has ventricular bigeminy and recommend cardiology consult and outpatient follow up.

## 2023-07-20 NOTE — Assessment & Plan Note (Signed)
 Pt was meeting sepsis criteria and was given IVF I have changed rate to 50 cc fro the remaining bolus to prevent volume overload as pt's BP is stable. Then we will saline lock. Lactic is normal. Cont MIVF. Cont IV abx coverage with rocephin and azithromycin.

## 2023-07-20 NOTE — Assessment & Plan Note (Signed)
 Nicotine patch

## 2023-07-20 NOTE — Assessment & Plan Note (Signed)
 Xray of knee. D/D include peroneal nerve compression.

## 2023-07-20 NOTE — Progress Notes (Signed)
 Patient arrived from [facility] ED by [facility].  Patient stable and A&O x 4.  CCMD notified.  Patient has no complaints other than being hungry.

## 2023-07-20 NOTE — Assessment & Plan Note (Signed)
 Continue sertraline

## 2023-07-20 NOTE — H&P (Signed)
 History and Physical    Patient: Katherine Blair GNF:[id number] DOB: 06-Sep-1965 DOA: 07/20/2023 DOS: the patient was seen and examined on 07/20/2023 PCP: Felix Pacini, FNP  Patient coming from:  [facility] Chief complaint: Chief Complaint  Patient presents with   Cough   Back Pain   HPI:  Katherine Blair is a 58 y.o. female with past medical history allergy to PCN,COPD, diabetes mellitus type 2, hypertension, GERD, of left lower back pain and left leg numbness that started on 12 March along with cough congestion but is been going on for about the past 3 weeks but no reports of fevers chills nausea vomiting diarrhea constipation , falls or dizziness. No incontinence, or saddle anesthesia.  At bedside patient is alert awake and oriented and reports that she has been coughing worse with greenish expectoration over the past few weeks.  Also reports of back pain is chronic and she has had steroid shots in her left hip.  Patient states that she has not followed up with GI for her Barrett's follow-up as she had lost insurance.  As far as left leg numbness patient states that she is having numbness affecting the left leg the lateral strip and is still numb from last Wednesday.  She has no loss of movement or weakness no trauma.  >>ED Course: Pt in ed is alert,awake and oriented. >>Vital signs in the ED were notable for the following:  Vitals:   07/20/23 1136 07/20/23 1138 07/20/23 1415 07/20/23 1526  BP: (!) 125/108 121/73 129/85 123/85  Pulse: (!) 111  (!) 50 91  Temp: 98.5 F (36.9 C)   98.4 F (36.9 C)  Resp: 18  17 18   Height:      Weight:      SpO2: 99%  96% 97%  TempSrc: Oral   Oral  BMI (Calculated):       >>Labs were notable for the following: CMP shows sodium 135 chloride 97 glucose 439 normal LFTs.  Lactic acid 1.6 CBC shows leukocytosis of 14.2 hemoglobin 16.2 platelets 246. Viral panel positive for RSV.  >>EKG: Independently reviewed: Sinus tach 125 ventricular bigeminy, q waves.    >>Imaging and additional notable ED work-up:  Head CT noncontrast negative for any acute findings. Chest x-ray shows right middle lobe pneumonia.  >>While in the ED patient received the following: Medications  lactated ringers infusion ( Intravenous New Bag/Given 07/20/23 1409)  lactated ringers bolus 1,000 mL (1,000 mLs Intravenous New Bag/Given 07/20/23 1218)  cefTRIAXone (ROCEPHIN) 1 g in sodium chloride 0.9 % 100 mL IVPB (0 g Intravenous Stopped 07/20/23 1330)  azithromycin (ZITHROMAX) 500 mg in sodium chloride 0.9 % 250 mL IVPB (500 mg Intravenous New Bag/Given 07/20/23 1333)   Review of Systems  HENT:  Positive for congestion.   Respiratory:  Positive for cough and shortness of breath.    Past Medical History:  Diagnosis Date   COPD (chronic obstructive pulmonary disease) (HCC)    Cushing's syndrome (HCC)    Diabetes mellitus without complication (HCC)    GERD (gastroesophageal reflux disease)    Hypertension    Thyroid disease    Past Surgical History:  Procedure Laterality Date   BLADDER SURGERY     CESAREAN SECTION     DILATION AND CURETTAGE OF UTERUS     PARTIAL HYSTERECTOMY     TUBAL LIGATION      reports that she has been smoking cigarettes. She has a 30 pack-year smoking history. She has never used smokeless tobacco. She reports  that she does not currently use alcohol. She reports that she does not use drugs.  Allergies  Allergen Reactions   Penicillins Rash    Family History  Problem Relation Age of Onset   Diabetes Mother    Bipolar disorder Father    Diabetes Father    Drug abuse Sister    Diabetes Sister     Prior to Admission medications   Medication Sig Start Date End Date Taking? Authorizing Provider  atorvastatin (LIPITOR) 20 MG tablet Take 20 mg by mouth at bedtime.    [provider]  dicyclomine (BENTYL) 20 MG tablet Take 1 tablet (20 mg total) by mouth 4 (four) times daily as needed for spasms. 12/04/19   Molpus, John, MD   doxycycline (VIBRAMYCIN) 100 MG capsule Take 1 capsule (100 mg total) by mouth 2 (two) times daily. 11/24/22   Eber Hong, MD  Dulaglutide (TRULICITY) 4.5 MG/0.5ML SOPN Inject 4.5 mg into the skin once a week. 03/16/20   Romero Belling, MD  GABAPENTIN PO Take by mouth.    [provider]  HYDROcodone-acetaminophen (NORCO/VICODIN) 5-325 MG tablet Take 2 tablets by mouth every 6 (six) hours as needed for severe pain. 12/25/21   Karie Mainland, Amjad, PA-C  hydrOXYzine (ATARAX) 10 MG tablet TAKE 1 TABLET BY MOUTH 3 TIMES DAILY AS NEEDED FOR ANXIETY. 03/05/22   Stasia Cavalier, MD  Insulin Glargine [facility]) 100 UNIT/ML Inject 130 Units into the skin daily. 11/24/22   Eber Hong, MD  Insulin Pen Needle (PEN NEEDLES) 32G X 5 MM MISC 1 Device by Does not apply route daily. 09/24/19   Romero Belling, MD  lidocaine (LIDODERM) 5 % Place 1 patch onto the skin daily. Remove & Discard patch within 12 hours or as directed by MD 12/25/21   Marita Kansas, PA-C  methocarbamol (ROBAXIN) 500 MG tablet Take 1 tablet (500 mg total) by mouth 2 (two) times daily. 12/25/21   Marita Kansas, PA-C  naproxen (NAPROSYN) 375 MG tablet Take 1 tablet (375 mg total) by mouth 2 (two) times daily. 12/25/21   Marita Kansas, PA-C  omeprazole (PRILOSEC) 20 MG capsule Take 20 mg by mouth daily.    [provider]  ondansetron (ZOFRAN ODT) 8 MG disintegrating tablet Take 1 tablet (8 mg total) by mouth every 8 (eight) hours as needed for nausea or vomiting. 12/04/19   Molpus, John, MD  PHENTERMINE HCL PO Take by mouth.    [provider]  sertraline (ZOLOFT) 100 MG tablet TAKE 1 TABLET BY MOUTH EVERY DAY 03/05/22   Stasia Cavalier, MD                                                                                   Vitals:   07/20/23 1136 07/20/23 1138 07/20/23 1415 07/20/23 1526  BP: (!) 125/108 121/73 129/85 123/85  Pulse: (!) 111  (!) 50 91  Resp: 18  17 18   Temp: 98.5 F (36.9 C)   98.4 F (36.9 C)  TempSrc: Oral    Oral  SpO2: 99%  96% 97%  Weight:      Height:       Physical Exam Vitals and nursing  note reviewed.  Constitutional:      General: She is not in acute distress. HENT:     Head: Normocephalic and atraumatic.     Right Ear: Hearing and external ear normal.     Left Ear: Hearing and external ear normal.     Nose: Nose normal. No nasal deformity.     Mouth/Throat:     Lips: Pink.     Tongue: No lesions.     Pharynx: Oropharynx is clear.  Eyes:     General: Lids are normal.     Extraocular Movements: Extraocular movements intact.  Cardiovascular:     Rate and Rhythm: Normal rate and regular rhythm.     Heart sounds: Normal heart sounds.  Pulmonary:     Effort: Pulmonary effort is normal.     Breath sounds: Normal breath sounds.  Abdominal:     General: Bowel sounds are normal. There is no distension.     Palpations: Abdomen is soft. There is no mass.     Tenderness: There is no abdominal tenderness.  Musculoskeletal:     Right lower leg: No edema.     Left lower leg: No edema.  Skin:    General: Skin is warm.  Neurological:     General: No focal deficit present.     Mental Status: She is alert and oriented to person, place, and time.     Cranial Nerves: Cranial nerves 2-12 are intact.  Psychiatric:        Attention and Perception: Attention normal.        Mood and Affect: Mood normal.        Speech: Speech normal.        Behavior: Behavior normal. Behavior is cooperative.      Labs on Admission: I have personally reviewed following labs and imaging studies  CBC: Recent Labs  Lab 07/20/23 1145  WBC 14.2*  NEUTROABS 9.9*  HGB 16.2*  HCT 47.8*  MCV 81.7  PLT 246   Basic Metabolic Panel: Recent Labs  Lab 07/20/23 1145  NA 135  K 3.7  CL 97*  CO2 28  GLUCOSE 439*  BUN 8  CREATININE 0.43*  CALCIUM 9.1   GFR: Estimated Creatinine Clearance: 71.8 mL/min (A) (by C-G formula based on SCr of 0.43 mg/dL (L)). Liver Function Tests: Recent Labs  Lab  07/20/23 1145  AST 11*  ALT 13  ALKPHOS 103  BILITOT 0.4  PROT 7.1  ALBUMIN 3.7   No results for input(s): "LIPASE", "AMYLASE" in the last 168 hours. No results for input(s): "AMMONIA" in the last 168 hours. Coagulation Profile: No results for input(s): "INR", "PROTIME" in the last 168 hours. Cardiac Enzymes: No results for input(s): "CKTOTAL", "CKMB", "CKMBINDEX", "TROPONINI" in the last 168 hours. BNP (last 3 results) No results for input(s): "PROBNP" in the last 8760 hours. HbA1C: No results for input(s): "HGBA1C" in the last 72 hours. CBG: No results for input(s): "GLUCAP" in the last 168 hours. Lipid Profile: No results for input(s): "CHOL", "HDL", "LDLCALC", "TRIG", "CHOLHDL", "LDLDIRECT" in the last 72 hours. Thyroid Function Tests: No results for input(s): "TSH", "T4TOTAL", "FREET4", "T3FREE", "THYROIDAB" in the last 72 hours. Anemia Panel: No results for input(s): "VITAMINB12", "FOLATE", "FERRITIN", "TIBC", "IRON", "RETICCTPCT" in the last 72 hours. Urine analysis:    Component Value Date/Time   COLORURINE YELLOW 09/24/2022 0842   APPEARANCEUR CLEAR 09/24/2022 0842   LABSPEC 1.038 (H) 09/24/2022 0842   PHURINE 5.5 09/24/2022 0842   GLUCOSEU >1,000 (A) 09/24/2022  0842   HGBUR NEGATIVE 09/24/2022 0842   BILIRUBINUR NEGATIVE 09/24/2022 0842   KETONESUR 15 (A) 09/24/2022 0842   PROTEINUR NEGATIVE 09/24/2022 0842   UROBILINOGEN 0.2 12/17/2009 1907   NITRITE NEGATIVE 09/24/2022 0842   LEUKOCYTESUR NEGATIVE 09/24/2022 0842   Radiological Exams on Admission: CT Head Wo Contrast Result Date: 07/20/2023 CLINICAL DATA:  Neuro deficit, concern for stroke. EXAM: CT HEAD WITHOUT CONTRAST TECHNIQUE: Contiguous axial images were obtained from the base of the skull through the vertex without intravenous contrast. RADIATION DOSE REDUCTION: This exam was performed according to the departmental dose-optimization program which includes automated exposure control, adjustment of the mA  and/or kV according to patient size and/or use of iterative reconstruction technique. COMPARISON:  None Available. FINDINGS: Brain: No acute intracranial hemorrhage. No CT evidence of acute infarct. No edema, mass effect, or midline shift. The basilar cisterns are patent. Ventricles: The ventricles are normal. Vascular: No hyperdense vessel or unexpected calcification. Skull: No acute or aggressive finding. Orbits: Orbits are symmetric. Sinuses: Mucosal thickening involving the left frontal sinus outflow tract and left anterior ethmoid air cells. Other: Mastoid air cells are clear. IMPRESSION: No CT evidence of acute intracranial abnormality. Electronically Signed   By: Emily Filbert M.D.   On: 07/20/2023 14:09   DG Chest 2 View Result Date: 07/20/2023 CLINICAL DATA:  Cough and congestion. EXAM: CHEST - 2 VIEW COMPARISON:  02/16/2019 and CT chest 11/16/2011. FINDINGS: Trachea is midline. Heart size normal. Right middle lobe airspace consolidation. No pleural fluid. IMPRESSION: Right middle lobe pneumonia. Followup PA and lateral chest X-ray is recommended in 3-4 weeks following trial of antibiotic therapy to ensure resolution and exclude underlying malignancy. Electronically Signed   By: Leanna Battles M.D.   On: 07/20/2023 12:20   Data Reviewed: Relevant notes from primary care and specialist visits, past discharge summaries as available in EHR, including Care Everywhere. Prior diagnostic testing as pertinent to current admission diagnoses, Updated medications and problem lists for reconciliation ED course, including vitals, labs, imaging, treatment and response to treatment,Triage notes, nursing and pharmacy notes and ED provider's notes Notable results as noted in HPI.Discussed case with EDMD/ ED APP/ or Specialty MD on call and as needed.  Assessment & Plan Sepsis due to pneumonia (HCC) Pt was meeting sepsis criteria and was given IVF I have changed rate to 50 cc fro the remaining bolus to prevent  volume overload as pt's BP is stable. Then we will saline lock. Lactic is normal. Cont MIVF. Cont IV abx coverage with rocephin and azithromycin.  Diabetes (HCC) A1c/ glycemic protocol.  GAD (generalized anxiety disorder) Continue sertraline.  Tobacco dependence Nicotine patch.  Barrett's esophagus with high grade dysplasia IV PPI q12h. Pt needs GI f/u.  RSV (respiratory syncytial virus pneumonia) Supportive care. PRN albuterol.  Numbness and tingling of left leg Xray of knee. D/D include peroneal nerve compression.  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    DVT prophylaxis:  Heparin  Consults:  None  Advance Care Planning:    Code Status: Full Code   Family Communication:  None  Disposition Plan:  Home  Severity of Illness: The appropriate patient status for this patient is INPATIENT. Inpatient status is judged to be reasonable and necessary in order to provide the required intensity of service to ensure the patient's safety. The patient's presenting symptoms, physical exam findings, and initial radiographic and laboratory data in the context of their chronic comorbidities is felt to place  them at high risk for further clinical deterioration. Furthermore, it is not anticipated that the patient will be medically stable for discharge from the hospital within 2 midnights of admission.   * I certify that at the point of admission it is my clinical judgment that the patient will require inpatient hospital care spanning beyond 2 midnights from the point of admission due to high intensity of service, high risk for further deterioration and high frequency of surveillance required.*  Author: Gertha Calkin, MD 07/20/2023 4:56 PM  For on call review [email or web address].   Unresulted Labs (From admission, onward)     Start     Ordered   07/21/23 0500  Magnesium  Tomorrow morning,   R        07/20/23 1534   07/21/23 0500   Comprehensive metabolic panel  Tomorrow morning,   R        07/20/23 1644   07/21/23 0500  CBC  Tomorrow morning,   R        07/20/23 1644   07/20/23 1641  Hemoglobin A1c  Add-on,   AD       Comments: To assess prior glycemic control    07/20/23 1644   07/20/23 1641  CBC  (heparin)  Once,   R       Comments: Baseline for heparin therapy IF NOT ALREADY DRAWN.  Notify MD if PLT < 100 K.    07/20/23 1644   07/20/23 1641  Creatinine, serum  (heparin)  Once,   R       Comments: Baseline for heparin therapy IF NOT ALREADY DRAWN.    07/20/23 1644   07/20/23 1641  HIV Antibody (routine testing w rflx)  (HIV Antibody (Routine testing w reflex) panel)  Once,   R        07/20/23 1644   07/20/23 1535  D-dimer, quantitative  Add-on,   AD        07/20/23 1534   07/20/23 1533  CK  Add-on,   AD        07/20/23 1533   07/20/23 1326  Lactic acid, plasma  (Lactic Acid)  Now then every 2 hours,   R      07/20/23 1325   07/20/23 1147  Blood culture (routine x 2)  BLOOD CULTURE X 2,   STAT      07/20/23 1146            Orders Placed This Encounter  Procedures   Critical Care   Resp panel by RT-PCR (RSV, Flu A&B, Covid) Anterior Nasal Swab   Blood culture (routine x 2)   DG Chest 2 View   CT Head Wo Contrast   DG Knee 1-2 Views Left   CBC with Differential   Comprehensive metabolic panel   Lactic acid, plasma   CK   D-dimer, quantitative   Magnesium   Hemoglobin A1c   CBC   Creatinine, serum   HIV Antibody (routine testing w rflx)   Comprehensive metabolic panel   CBC   Diet Carb Modified Fluid consistency: Thin; Room service appropriate? Yes   DO NOT delay antibiotics if unable to obtain blood culture.   Cardiac Monitoring - Continuous Indefinite   Apply Diabetes Mellitus Care Plan   STAT CBG when hypoglycemia is suspected. If treated, recheck every 15 minutes after each treatment until CBG >/= 70 mg/dl   Refer to Hypoglycemia Protocol Sidebar Report for treatment of CBG < 70  mg/dl   No HS  correction Insulin   Maintain IV access   Vital signs   Notify physician (specify)   Mobility Protocol: No Restrictions RN to initiate protocols based on patient's level of care   Refer to Sidebar Report Refer to ICU, Med-Surg, Progressive, and Step-Down Mobility Protocol Sidebars   Initiate Adult Central Line Maintenance and Catheter Protocol for patients with central line (CVC, PICC, Port, Hemodialysis, Trialysis)   Daily weights   Intake and Output   Do not place and if present remove PureWick   Initiate Oral Care Protocol   Initiate Carrier Fluid Protocol   RN may order General Admission PRN Orders utilizing "General Admission PRN medications" (through manage orders) for the following patient needs: allergy symptoms (Claritin), cold sores (Carmex), cough (Robitussin DM), eye irritation (Liquifilm Tears), hemorrhoids (Tucks), indigestion (Maalox), minor skin irritation (Hydrocortisone Cream), muscle pain Romeo Apple Gay), nose irritation (saline nasal spray) and sore throat (Chloraseptic spray).   Patient has an active order for admit to inpatient/place in observation   Cardiac Monitoring Continuous x 48 hours Indications for use: Other; Other indications for use: PNA   Full code   Code Sepsis activation.  This occurs automatically when order is signed and prioritizes pharmacy, lab, and radiology services for STAT collections and interventions.  If [facility] downtime, call [facility] [phone]) to activate Code Sepsis.   Consult to hospitalist   Pulse oximetry check with vital signs   Oxygen therapy Mode or (Route): Nasal cannula; Liters Per Minute: 2; Keep O2 saturation between: greater than 92 %   EKG 12-Lead   EKG 12-Lead   EKG   EKG   Insert 2nd peripheral IV if not already present.   Admit to Inpatient (patient's expected length of stay will be greater than 2 midnights or inpatient only procedure)   Aspiration precautions   Fall precautions

## 2023-07-20 NOTE — Sepsis Progress Note (Signed)
 Sepsis protocol is being followed by [facility].

## 2023-07-20 NOTE — Assessment & Plan Note (Signed)
 Supportive care. PRN albuterol.

## 2023-07-21 DIAGNOSIS — J189 Pneumonia, unspecified organism: Secondary | ICD-10-CM | POA: Diagnosis not present

## 2023-07-21 DIAGNOSIS — A419 Sepsis, unspecified organism: Secondary | ICD-10-CM | POA: Diagnosis not present

## 2023-07-21 LAB — CBC
HCT: 42.9 % (ref 36.0–46.0)
Hemoglobin: 14.4 g/dL (ref 12.0–15.0)
MCH: 28 pg (ref 26.0–34.0)
MCHC: 33.6 g/dL (ref 30.0–36.0)
MCV: 83.3 fL (ref 80.0–100.0)
Platelets: 200 10*3/uL (ref 150–400)
RBC: 5.15 MIL/uL — ABNORMAL HIGH (ref 3.87–5.11)
RDW: 12.4 % (ref 11.5–15.5)
WBC: 13.4 10*3/uL — ABNORMAL HIGH (ref 4.0–10.5)
nRBC: 0 % (ref 0.0–0.2)

## 2023-07-21 LAB — HEMOGLOBIN A1C
Hgb A1c MFr Bld: 14.3 % — ABNORMAL HIGH (ref 4.8–5.6)
Mean Plasma Glucose: 363.71 mg/dL

## 2023-07-21 LAB — COMPREHENSIVE METABOLIC PANEL
ALT: 13 U/L (ref 0–44)
AST: 14 U/L — ABNORMAL LOW (ref 15–41)
Albumin: 2.5 g/dL — ABNORMAL LOW (ref 3.5–5.0)
Alkaline Phosphatase: 74 U/L (ref 38–126)
Anion gap: 8 (ref 5–15)
BUN: 8 mg/dL (ref 6–20)
CO2: 23 mmol/L (ref 22–32)
Calcium: 8 mg/dL — ABNORMAL LOW (ref 8.9–10.3)
Chloride: 102 mmol/L (ref 98–111)
Creatinine, Ser: 0.41 mg/dL — ABNORMAL LOW (ref 0.44–1.00)
GFR, Estimated: >60 mL/min (ref >60)
Glucose, Bld: 359 mg/dL — ABNORMAL HIGH (ref 70–99)
Potassium: 3.8 mmol/L (ref 3.5–5.1)
Sodium: 133 mmol/L — ABNORMAL LOW (ref 135–145)
Total Bilirubin: 0.3 mg/dL (ref 0.0–1.2)
Total Protein: 5.7 g/dL — ABNORMAL LOW (ref 6.5–8.1)

## 2023-07-21 LAB — GLUCOSE, CAPILLARY
Glucose-Capillary: 339 mg/dL — ABNORMAL HIGH (ref 70–99)
Glucose-Capillary: 250 mg/dL — ABNORMAL HIGH (ref 70–99)
Glucose-Capillary: 222 mg/dL — ABNORMAL HIGH (ref 70–99)
Glucose-Capillary: 234 mg/dL — ABNORMAL HIGH (ref 70–99)

## 2023-07-21 LAB — MAGNESIUM: Magnesium: 1.5 mg/dL — ABNORMAL LOW (ref 1.7–2.4)

## 2023-07-21 MED ORDER — FLUTICASONE PROPIONATE 50 MCG/ACT NA SUSP
2.0000 | Freq: Every day | NASAL | Status: DC
  Administered 2023-07-21 – 2023-07-24 (×4): 2 via NASAL
  Filled 2023-07-21: qty 16

## 2023-07-21 MED ORDER — INSULIN GLARGINE-YFGN 100 UNIT/ML ~~LOC~~ SOPN: 15.0000 [IU] | PEN_INJECTOR | SUBCUTANEOUS | Status: DC

## 2023-07-21 MED ORDER — FLUCONAZOLE 150 MG PO TABS
150.0000 mg | ORAL_TABLET | Freq: Once | ORAL | Status: AC
  Administered 2023-07-21: 150 mg via ORAL
  Filled 2023-07-21: qty 1

## 2023-07-21 MED ORDER — INSULIN GLARGINE 100 UNIT/ML ~~LOC~~ SOLN
15.0000 [IU] | Freq: Every day | SUBCUTANEOUS | Status: DC
  Administered 2023-07-22: 15 [IU] via SUBCUTANEOUS
  Filled 2023-07-21 (×3): qty 0.15

## 2023-07-21 MED ORDER — LORATADINE 10 MG PO TABS
10.0000 mg | ORAL_TABLET | Freq: Every day | ORAL | Status: DC
  Administered 2023-07-21 – 2023-07-24 (×4): 10 mg via ORAL
  Filled 2023-07-21 (×4): qty 1

## 2023-07-21 NOTE — Progress Notes (Signed)
 PROGRESS NOTE    Katherine Blair  WJX:[id number] DOB: [birth year] DOA: 07/20/2023 PCP: Felix Pacini, FNP  Outpatient Specialists:     Brief Narrative:  Patient is a 58 year old female with past medical history significant for COPD, diabetes mellitus type 2, hypertension, GERD and penicillin allergy.  Due to lack of insurance, patient has not seek medical care in the last 1 year.  Patient also has low back pain.  Patient was admitted with community-acquired pneumonia (right middle lobe), with documented sepsis on presentation.  Patient has over 40 pack years.  According to the patient, she has had productive cough for over 3 weeks.  Cough is productive of yellowish and occasionally whitish sputum.  No clear fever or chills.  There is also reported numbness around the lateral aspect of left lower leg, but none improved.  As documented above, patient is a diabetic.  Patient is currently on IV Rocephin and azithromycin.  07/21/2023: Patient seen alongside patient's daughter.  Above documentation noted.  Will check A1c.   Assessment & Plan:   Principal Problem:   Sepsis due to pneumonia [facility]) Active Problems:   Diabetes (HCC)   GAD (generalized anxiety disorder)   Tobacco dependence   Barrett's esophagus with high grade dysplasia   RSV (respiratory syncytial virus pneumonia)   Numbness and tingling of left leg   Ventricular bigeminy   Community-acquired pneumonia: Possible sepsis documented on presentation: Positive RSV: -Sepsis physiology has resolved. -Continue IV Rocephin and azithromycin. -Follow cultures.  Diabetes mellitus: -Uncontrolled. -A1c of 14.3%.   -Consult diabetes coordinator. -Patient has not had care in over a year due to lack of insurance. -Start insulin.  Tobacco use disorder: -Patient has had over 40 pack years. -Counseled to quit. -Follow repeat chest x-ray in 4 to 6 weeks.  Barrett's esophagus with high-grade dysplasia: -GI follow-up on  discharge. -Patient will likely need EGD. -Continue IV Protonix. -Low threshold to add Carafate.  Numbness and tingling of lateral aspect of left lower leg: -Improved significantly. -Further management will depend on above.  Upper airway cough syndrome: -Start Flonase and Zyrtec.   DVT prophylaxis: Cutaneous heparin. Code Status: Full code. Family Communication: Seen alongside daughter. Disposition Plan: Patient remains inpatient.   Consultants:  None.  Procedures:  None.  Antimicrobials:  IV Rocephin. IV azithromycin.   Subjective: -No fever and chills. -Slowly improving. -Sepsis physiology has resolved.  Objective: Vitals:   07/21/23 0400 07/21/23 0700 07/21/23 0848 07/21/23 1646  BP: (!) 102/55 120/74  135/77  Pulse: 79 83    Resp: 16 20    Temp: 97.9 F (36.6 C) 98.7 F (37.1 C)  98.2 F (36.8 C)  TempSrc: Oral Oral  Oral  SpO2: 95% 95% 98%   Weight:      Height:        Intake/Output Summary (Last 24 hours) at 07/21/2023 1723 Last data filed at 07/20/2023 2104 Gross per 24 hour  Intake 6 ml  Output --  Net 6 ml   Filed Weights   07/20/23 1133 07/21/23 0333  Weight: 67.1 kg 69.5 kg    Examination:  General exam: Appears calm and comfortable  Respiratory system: Clear to auscultation.  Cardiovascular system: S1 & S2 heard Gastrointestinal system: Abdomen is soft and nontender. Central nervous system: Awake and alert.  Moves all extremities.  Extremities: No leg edema.  Data Reviewed: I have personally reviewed following labs and imaging studies  CBC: Recent Labs  Lab 07/20/23 1145 07/20/23 1832 07/21/23 0311  WBC 14.2* 17.0*  13.4*  NEUTROABS 9.9*  --   --   HGB 16.2* 15.5* 14.4  HCT 47.8* 45.9 42.9  MCV 81.7 82.3 83.3  PLT 246 224 200   Basic Metabolic Panel: Recent Labs  Lab 07/20/23 1145 07/20/23 1832 07/21/23 0311  NA 135  --  133*  K 3.7  --  3.8  CL 97*  --  102  CO2 28  --  23  GLUCOSE 439*  --  359*  BUN 8  --  8   CREATININE 0.43* 0.38* 0.41*  CALCIUM 9.1  --  8.0*  MG  --   --  1.5*   GFR: Estimated Creatinine Clearance: 71.8 mL/min (A) (by C-G formula based on SCr of 0.41 mg/dL (L)). Liver Function Tests: Recent Labs  Lab 07/20/23 1145 07/21/23 0311  AST 11* 14*  ALT 13 13  ALKPHOS 103 74  BILITOT 0.4 0.3  PROT 7.1 5.7*  ALBUMIN 3.7 2.5*   No results for input(s): "LIPASE", "AMYLASE" in the last 168 hours. No results for input(s): "AMMONIA" in the last 168 hours. Coagulation Profile: No results for input(s): "INR", "PROTIME" in the last 168 hours. Cardiac Enzymes: Recent Labs  Lab 07/20/23 1622  CKTOTAL 42   BNP (last 3 results) No results for input(s): "PROBNP" in the last 8760 hours. HbA1C: Recent Labs    07/21/23 1208  HGBA1C 14.3*   CBG: Recent Labs  Lab 07/20/23 1720 07/20/23 2100 07/21/23 0541 07/21/23 0810 07/21/23 1647  GLUCAP 289* 216* 339* 250* 222*   Lipid Profile: No results for input(s): "CHOL", "HDL", "LDLCALC", "TRIG", "CHOLHDL", "LDLDIRECT" in the last 72 hours. Thyroid Function Tests: No results for input(s): "TSH", "T4TOTAL", "FREET4", "T3FREE", "THYROIDAB" in the last 72 hours. Anemia Panel: No results for input(s): "VITAMINB12", "FOLATE", "FERRITIN", "TIBC", "IRON", "RETICCTPCT" in the last 72 hours. Urine analysis:    Component Value Date/Time   COLORURINE YELLOW 09/24/2022 0842   APPEARANCEUR CLEAR 09/24/2022 0842   LABSPEC 1.038 (H) 09/24/2022 0842   PHURINE 5.5 09/24/2022 0842   GLUCOSEU >1,000 (A) 09/24/2022 0842   HGBUR NEGATIVE 09/24/2022 0842   BILIRUBINUR NEGATIVE 09/24/2022 0842   KETONESUR 15 (A) 09/24/2022 0842   PROTEINUR NEGATIVE 09/24/2022 0842   UROBILINOGEN 0.2 12/17/2009 1907   NITRITE NEGATIVE 09/24/2022 0842   LEUKOCYTESUR NEGATIVE 09/24/2022 0842   Sepsis Labs: @LABRCNTIP (procalcitonin:4,lacticidven:4)  ) Recent Results (from the past 240 hours)  Blood culture (routine x 2)     Status: None (Preliminary  result)   Collection Time: 07/20/23 11:47 AM   Specimen: BLOOD  Result Value Ref Range Status   Specimen Description   Final    BLOOD RIGHT ANTECUBITAL Performed at [facility], [address] [place], [place] [address]    Special Requests   Final    NONE Performed at [facility], [address] [place], [place] [address]    Culture   Final    NO GROWTH < 24 HOURS Performed at [facility], [address] [place], [place] [address]    Report Status PENDING  Incomplete  Resp panel by RT-PCR (RSV, Flu A&B, Covid) Anterior Nasal Swab     Status: Abnormal   Collection Time: 07/20/23 12:08 PM   Specimen: Anterior Nasal Swab  Result Value Ref Range Status   SARS Coronavirus 2 by RT PCR NEGATIVE NEGATIVE Final    Comment: (NOTE) SARS-CoV-2 target nucleic acids are NOT DETECTED.  The SARS-CoV-2 RNA is generally detectable in upper respiratory specimens  during the acute phase of infection. The lowest concentration of SARS-CoV-2 viral copies this assay can detect is 138 copies/mL. A negative result does not preclude SARS-Cov-2 infection and should not be used as the sole basis for treatment or other patient management decisions. A negative result may occur with  improper specimen collection/handling, submission of specimen other than nasopharyngeal swab, presence of viral mutation(s) within the areas targeted by this assay, and inadequate number of viral copies(<138 copies/mL). A negative result must be combined with clinical observations, patient history, and epidemiological information. The expected result is Negative.  Fact Sheet for Patients:  BloggerCourse.com  Fact Sheet for Healthcare Providers:  SeriousBroker.it  This test is no t yet approved or cleared by the [place] FDA and  has been authorized for detection and/or diagnosis of SARS-CoV-2 by FDA under an Emergency Use  Authorization (EUA). This EUA will remain  in effect (meaning this test can be used) for the duration of the COVID-19 declaration under Section 564(b)(1) of the Act, 21 U.S.C.section 360bbb-3(b)(1), unless the authorization is terminated  or revoked sooner.       Influenza A by PCR NEGATIVE NEGATIVE Final   Influenza B by PCR NEGATIVE NEGATIVE Final    Comment: (NOTE) The Xpert Xpress SARS-CoV-2/FLU/RSV plus assay is intended as an aid in the diagnosis of influenza from Nasopharyngeal swab specimens and should not be used as a sole basis for treatment. Nasal washings and aspirates are unacceptable for Xpert Xpress SARS-CoV-2/FLU/RSV testing.  Fact Sheet for Patients: BloggerCourse.com  Fact Sheet for Healthcare Providers: SeriousBroker.it  This test is not yet approved or cleared by the [place] FDA and has been authorized for detection and/or diagnosis of SARS-CoV-2 by FDA under an Emergency Use Authorization (EUA). This EUA will remain in effect (meaning this test can be used) for the duration of the COVID-19 declaration under Section 564(b)(1) of the Act, 21 U.S.C. section 360bbb-3(b)(1), unless the authorization is terminated or revoked.     Resp Syncytial Virus by PCR POSITIVE (A) NEGATIVE Final    Comment: (NOTE) Fact Sheet for Patients: BloggerCourse.com  Fact Sheet for Healthcare Providers: SeriousBroker.it  This test is not yet approved or cleared by the [place] FDA and has been authorized for detection and/or diagnosis of SARS-CoV-2 by FDA under an Emergency Use Authorization (EUA). This EUA will remain in effect (meaning this test can be used) for the duration of the COVID-19 declaration under Section 564(b)(1) of the Act, 21 U.S.C. section 360bbb-3(b)(1), unless the authorization is terminated or revoked.  Performed at [facility],  [address], [place], [place] [address]   Blood culture (routine x 2)     Status: None (Preliminary result)   Collection Time: 07/20/23  4:22 PM   Specimen: BLOOD LEFT ARM  Result Value Ref Range Status   Specimen Description BLOOD LEFT ARM  Final   Special Requests   Final    AEROBIC BOTTLE ONLY Blood Culture results may not be optimal due to an inadequate volume of blood received in culture bottles   Culture   Final    NO GROWTH < 24 HOURS Performed at [facility], [address], [place], [place] [address]    Report Status PENDING  Incomplete         Radiology Studies: DG Knee 1-2 Views Left Result Date: 07/20/2023 CLINICAL DATA:  Left knee numbness, no known injury, initial encounter EXAM: LEFT KNEE - 2 VIEW COMPARISON:  None Available. FINDINGS: No evidence  of fracture, dislocation, or joint effusion. No evidence of arthropathy or other focal bone abnormality. Soft tissues are unremarkable. IMPRESSION: No acute abnormality noted. Electronically Signed   By: Alcide Clever M.D.   On: 07/20/2023 21:39   CT Head Wo Contrast Result Date: 07/20/2023 CLINICAL DATA:  Neuro deficit, concern for stroke. EXAM: CT HEAD WITHOUT CONTRAST TECHNIQUE: Contiguous axial images were obtained from the base of the skull through the vertex without intravenous contrast. RADIATION DOSE REDUCTION: This exam was performed according to the departmental dose-optimization program which includes automated exposure control, adjustment of the mA and/or kV according to patient size and/or use of iterative reconstruction technique. COMPARISON:  None Available. FINDINGS: Brain: No acute intracranial hemorrhage. No CT evidence of acute infarct. No edema, mass effect, or midline shift. The basilar cisterns are patent. Ventricles: The ventricles are normal. Vascular: No hyperdense vessel or unexpected calcification. Skull: No acute or aggressive finding. Orbits: Orbits are symmetric. Sinuses: Mucosal thickening  involving the left frontal sinus outflow tract and left anterior ethmoid air cells. Other: Mastoid air cells are clear. IMPRESSION: No CT evidence of acute intracranial abnormality. Electronically Signed   By: Emily Filbert M.D.   On: 07/20/2023 14:09   DG Chest 2 View Result Date: 07/20/2023 CLINICAL DATA:  Cough and congestion. EXAM: CHEST - 2 VIEW COMPARISON:  02/16/2019 and CT chest 11/16/2011. FINDINGS: Trachea is midline. Heart size normal. Right middle lobe airspace consolidation. No pleural fluid. IMPRESSION: Right middle lobe pneumonia. Followup PA and lateral chest X-ray is recommended in 3-4 weeks following trial of antibiotic therapy to ensure resolution and exclude underlying malignancy. Electronically Signed   By: Leanna Battles M.D.   On: 07/20/2023 12:20        Scheduled Meds:  atorvastatin  20 mg Oral QHS   budesonide (PULMICORT) nebulizer solution  0.25 mg Nebulization BID   fluticasone  2 spray Each Nare Daily   heparin  5,000 Units Subcutaneous Q8H   insulin aspart  0-15 Units Subcutaneous TID WC   insulin glargine  15 Units Subcutaneous Daily   loratadine  10 mg Oral Daily   nicotine  21 mg Transdermal Daily   pantoprazole (PROTONIX) IV  40 mg Intravenous Q12H   sertraline  100 mg Oral Daily   sodium chloride flush  3 mL Intravenous Q12H   sodium chloride flush  3 mL Intravenous Q12H   Continuous Infusions:  azithromycin 500 mg (07/21/23 1009)   cefTRIAXone (ROCEPHIN)  IV 2 g (07/20/23 2322)     LOS: 1 day    Time spent: 35 minutes.    Berton Mount, MD  [facility] Pager #: [phone] 7PM-7AM contact night coverage as above

## 2023-07-21 NOTE — Inpatient Diabetes Management (Signed)
 Inpatient Diabetes Program Recommendations  AACE/ADA: New Consensus Statement on Inpatient Glycemic Control   Target Ranges:  Prepandial:   less than 140 mg/dL      Peak postprandial:   less than 180 mg/dL (1-2 hours)      Critically ill patients:  140 - 180 mg/dL    Latest Reference Range & Units 07/20/23 17:20 07/20/23 21:00 07/21/23 05:41 07/21/23 08:10  Glucose-Capillary 70 - 99 mg/dL 161 (H) 096 (H) 045 (H) 250 (H)   Review of Glycemic Control  Diabetes history: DM2 Outpatient Diabetes medications: None; has been on insulin in past Current orders for Inpatient glycemic control: Novolog 0-15 units TID with meals  Inpatient Diabetes Program Recommendations:    Insulin: Please consider ordering Semglee 14 units Q24H, Novolog 0-5 units at bedtime, and Novolog 3 units TID with meals for meal coverage if patient eats at least 50% of meals.  NOTE: Noted consult for Diabetes Coordinator. Diabetes Coordinator is not on campus over the weekend but available by pager from 8am to 5pm for questions or concerns. Chart reviewed. Per chart, patient has DM2 and has not been treating DM effectively recently due to loss of insurance but recently started a new job. Inpatient diabetes team will follow up with patient on Monday 07/22/23 when on campus.  Thanks Orlando Penner, RN, MSN, CDCES Diabetes Coordinator Inpatient Diabetes Program [phone] (Team Pager from 8am to 5pm)   Thanks, Orlando Penner, RN, MSN, CDCES Diabetes Coordinator Inpatient Diabetes Program [phone] (Team Pager from 8am to 5pm)

## 2023-07-22 ENCOUNTER — Other Ambulatory Visit (HOSPITAL_COMMUNITY): Payer: Self-pay

## 2023-07-22 ENCOUNTER — Telehealth (HOSPITAL_COMMUNITY): Payer: Self-pay | Admitting: Pharmacy Technician

## 2023-07-22 ENCOUNTER — Encounter: Payer: Self-pay | Admitting: Gastroenterology

## 2023-07-22 DIAGNOSIS — A419 Sepsis, unspecified organism: Secondary | ICD-10-CM | POA: Diagnosis not present

## 2023-07-22 DIAGNOSIS — J189 Pneumonia, unspecified organism: Secondary | ICD-10-CM | POA: Diagnosis not present

## 2023-07-22 LAB — RENAL FUNCTION PANEL
Albumin: 2.6 g/dL — ABNORMAL LOW (ref 3.5–5.0)
Anion gap: 5 (ref 5–15)
BUN: 7 mg/dL (ref 6–20)
CO2: 27 mmol/L (ref 22–32)
Calcium: 8.5 mg/dL — ABNORMAL LOW (ref 8.9–10.3)
Chloride: 103 mmol/L (ref 98–111)
Creatinine, Ser: 0.39 mg/dL — ABNORMAL LOW (ref 0.44–1.00)
GFR, Estimated: >60 mL/min (ref >60)
Glucose, Bld: 243 mg/dL — ABNORMAL HIGH (ref 70–99)
Phosphorus: 3.7 mg/dL (ref 2.5–4.6)
Potassium: 3.9 mmol/L (ref 3.5–5.1)
Sodium: 135 mmol/L (ref 135–145)

## 2023-07-22 LAB — CBC WITH DIFFERENTIAL/PLATELET
Abs Immature Granulocytes: 0.1 10*3/uL — ABNORMAL HIGH (ref 0.00–0.07)
Basophils Absolute: 0.1 10*3/uL (ref 0.0–0.1)
Basophils Relative: 1 %
Eosinophils Absolute: 0.3 10*3/uL (ref 0.0–0.5)
Eosinophils Relative: 2 %
HCT: 41.7 % (ref 36.0–46.0)
Hemoglobin: 13.9 g/dL (ref 12.0–15.0)
Immature Granulocytes: 1 %
Lymphocytes Relative: 30 %
Lymphs Abs: 3.2 10*3/uL (ref 0.7–4.0)
MCH: 27.9 pg (ref 26.0–34.0)
MCHC: 33.3 g/dL (ref 30.0–36.0)
MCV: 83.6 fL (ref 80.0–100.0)
Monocytes Absolute: 0.6 10*3/uL (ref 0.1–1.0)
Monocytes Relative: 6 %
Neutro Abs: 6.3 10*3/uL (ref 1.7–7.7)
Neutrophils Relative %: 60 %
Platelets: 190 10*3/uL (ref 150–400)
RBC: 4.99 MIL/uL (ref 3.87–5.11)
RDW: 12.3 % (ref 11.5–15.5)
WBC: 10.6 10*3/uL — ABNORMAL HIGH (ref 4.0–10.5)
nRBC: 0 % (ref 0.0–0.2)

## 2023-07-22 LAB — URINALYSIS, ROUTINE W REFLEX MICROSCOPIC
Bilirubin Urine: NEGATIVE
Glucose, UA: NEGATIVE mg/dL
Hgb urine dipstick: NEGATIVE
Ketones, ur: NEGATIVE mg/dL
Leukocytes,Ua: NEGATIVE
Nitrite: NEGATIVE
Protein, ur: NEGATIVE mg/dL
Specific Gravity, Urine: 1.015 (ref 1.005–1.030)
pH: 7 (ref 5.0–8.0)

## 2023-07-22 LAB — LIPID PANEL
Cholesterol: 149 mg/dL (ref 0–200)
HDL: 29 mg/dL — ABNORMAL LOW (ref >40)
LDL Cholesterol: 66 mg/dL (ref 0–99)
Total CHOL/HDL Ratio: 5.1 ratio
Triglycerides: 271 mg/dL — ABNORMAL HIGH (ref <150)
VLDL: 54 mg/dL — ABNORMAL HIGH (ref 0–40)

## 2023-07-22 LAB — HEMOGLOBIN A1C
Hgb A1c MFr Bld: 14.9 % — ABNORMAL HIGH (ref 4.8–5.6)
Mean Plasma Glucose: 381 mg/dL

## 2023-07-22 LAB — GLUCOSE, CAPILLARY
Glucose-Capillary: 225 mg/dL — ABNORMAL HIGH (ref 70–99)
Glucose-Capillary: 210 mg/dL — ABNORMAL HIGH (ref 70–99)
Glucose-Capillary: 143 mg/dL — ABNORMAL HIGH (ref 70–99)
Glucose-Capillary: 188 mg/dL — ABNORMAL HIGH (ref 70–99)

## 2023-07-22 LAB — MAGNESIUM: Magnesium: 1.5 mg/dL — ABNORMAL LOW (ref 1.7–2.4)

## 2023-07-22 MED ORDER — PANTOPRAZOLE SODIUM 40 MG PO TBEC
40.0000 mg | DELAYED_RELEASE_TABLET | Freq: Two times a day (BID) | ORAL | Status: DC
  Administered 2023-07-22 – 2023-07-24 (×4): 40 mg via ORAL
  Filled 2023-07-22 (×4): qty 1

## 2023-07-22 MED ORDER — AZITHROMYCIN 500 MG PO TABS
500.0000 mg | ORAL_TABLET | Freq: Every day | ORAL | Status: AC
  Administered 2023-07-23 – 2023-07-24 (×2): 500 mg via ORAL
  Filled 2023-07-22 (×2): qty 1

## 2023-07-22 MED ORDER — INSULIN GLARGINE-YFGN 100 UNIT/ML ~~LOC~~ SOLN
15.0000 [IU] | Freq: Every day | SUBCUTANEOUS | Status: DC
  Administered 2023-07-23 – 2023-07-24 (×2): 15 [IU] via SUBCUTANEOUS
  Filled 2023-07-22 (×2): qty 0.15

## 2023-07-22 MED ORDER — LIVING WELL WITH DIABETES BOOK
Freq: Once | Status: AC
  Filled 2023-07-22: qty 1

## 2023-07-22 NOTE — Evaluation (Signed)
 Clinical/Bedside Swallow Evaluation Patient Details  Name: Katherine Blair MRN: [id number] Date of Birth: 1965/11/01  Today's Date: 07/22/2023 Time: SLP Start Time (ACUTE ONLY): 1653 SLP Stop Time (ACUTE ONLY): 1701 SLP Time Calculation (min) (ACUTE ONLY): 8 min  Past Medical History:  Past Medical History:  Diagnosis Date   COPD (chronic obstructive pulmonary disease) (HCC)    Cushing's syndrome (HCC)    Diabetes mellitus without complication (HCC)    GERD (gastroesophageal reflux disease)    Hypertension    Thyroid disease    Past Surgical History:  Past Surgical History:  Procedure Laterality Date   BLADDER SURGERY     CESAREAN SECTION     DILATION AND CURETTAGE OF UTERUS     PARTIAL HYSTERECTOMY     TUBAL LIGATION     HPI:  Katherine Blair is a 58 yo female presenting to ED 3/22 with a productive cough. Admitted with R middle lobe CAP and RSV. PMH includes COPD, T2DM, HTN, GERD, penicillin allergy, tobacco dependence, Barrett's esophagus with high-grade dysplasia    Assessment / Plan / Recommendation  Clinical Impression  Pt reports a history of significant esophageal dysphagia, which she manages by choosing foods that she knows are more tolerable. Observed with trials of thin liquids and solids without overt s/s of dysphagia or aspiration. Education was provided regarding esophageal precautions. She states she will f/u with her GI MD upon d/c. Recommend she continue current diet of regular solids and thin liquids without further SLP f/u. Will sign off at this time. SLP Visit Diagnosis: Dysphagia, unspecified (R13.10)    Aspiration Risk  Mild aspiration risk    Diet Recommendation Regular;Thin liquid    Liquid Administration via: Cup;Straw Medication Administration: Whole meds with liquid Supervision: Patient able to self feed Compensations: Slow rate;Small sips/bites Postural Changes: Seated upright at 90 degrees;Remain upright for at least 30 minutes after po intake    Other   Recommendations Recommended Consults: Consider GI evaluation;Consider esophageal assessment Oral Care Recommendations: Oral care BID    Recommendations for follow up therapy are one component of a multi-disciplinary discharge planning process, led by the attending physician.  Recommendations may be updated based on patient status, additional functional criteria and insurance authorization.  Follow up Recommendations No SLP follow up      Assistance Recommended at Discharge    Functional Status Assessment Patient has not had a recent decline in their functional status  Frequency and Duration            Prognosis Prognosis for improved oropharyngeal function: Good      Swallow Study   General HPI: Katherine Blair is a 58 yo female presenting to ED 3/22 with a productive cough. Admitted with R middle lobe CAP and RSV. PMH includes COPD, T2DM, HTN, GERD, penicillin allergy, tobacco dependence, Barrett's esophagus with high-grade dysplasia Type of Study: Bedside Swallow Evaluation Previous Swallow Assessment: none in chart Diet Prior to this Study: Regular;Thin liquids (Level 0) Temperature Spikes Noted: No Respiratory Status: Room air History of Recent Intubation: No Behavior/Cognition: Alert;Cooperative;Pleasant mood Oral Cavity Assessment: Within Functional Limits Oral Care Completed by SLP: No Oral Cavity - Dentition: Adequate natural dentition Vision: Functional for self-feeding Self-Feeding Abilities: Able to feed self Patient Positioning: Upright in bed Baseline Vocal Quality: Normal Volitional Cough: Strong Volitional Swallow: Able to elicit    Oral/Motor/Sensory Function Overall Oral Motor/Sensory Function: Within functional limits   Ice Chips Ice chips: Not tested   Thin Liquid Thin Liquid: Within functional limits Presentation: Straw;Self  Fed    Nectar Thick Nectar Thick Liquid: Not tested   Honey Thick Honey Thick Liquid: Not tested   Puree Puree: Not tested    Solid     Solid: Within functional limits Presentation: Self Fed      Katherine Blair, M.A., CF-SLP Speech Language Pathology, Acute Rehabilitation Services  Secure Chat preferred [phone]  07/22/2023,5:06 PM

## 2023-07-22 NOTE — Telephone Encounter (Signed)
 Patient Product/process development scientist completed.    The patient is insured through [employer]. Patient has [employer], may use a copay card, and/or apply for patient assistance if available.    Ran test claim for Basaglar Pen and the current 30 day co-pay is $25.00.  Ran test claim for Dexcom G7 Sensor and Requires Prior Authorization  Ran test claim for [employer] 3 Sensor and Product Not Covered  This test claim was processed through [employer]- copay amounts may vary at other pharmacies due to [employer], or as the patient moves through the different stages of their insurance plan.     Roland Earl, CPHT Pharmacy Technician III Certified Patient Advocate [facility] Patient Advocate Team Direct Number: [phone]  Fax: [phone]

## 2023-07-22 NOTE — Inpatient Diabetes Management (Signed)
 Inpatient Diabetes Program Recommendations  AACE/ADA: New Consensus Statement on Inpatient Glycemic Control (2015)  Target Ranges:  Prepandial:   less than 140 mg/dL      Peak postprandial:   less than 180 mg/dL (1-2 hours)      Critically ill patients:  140 - 180 mg/dL   Lab Results  Component Value Date   GLUCAP 210 (H) 07/22/2023   HGBA1C 14.3 (H) 07/21/2023    Review of Glycemic Control  Latest Reference Range & Units 07/21/23 16:47 07/21/23 21:08 07/22/23 06:05 07/22/23 12:35  Glucose-Capillary 70 - 99 mg/dL 161 (H) 096 (H) 045 (H) 210 (H)   Diabetes history: DM 2 Outpatient Diabetes medications: None Current orders for Inpatient glycemic control:  Novolog 0-15 units tid with meals Lantus 15 units daily Inpatient Diabetes Program Recommendations:    Note that patient did not receive her basal insulin yesterday?  Alerted MD.   Sherron Monday at length with patient regarding DM management.  Patient discussed the past year and difficulties including losing home, job, and insurance.  She states she has lost 60 pounds.  She was previously on Basaglar 120 units daily but states that this did not control her blood sugars.  She also was on metformin in the past but had lots of diarrhea and could not tolerate.  She asked about SGLT-2's but I explained that MD's often will not start until blood sugars are better controlled to prevent yeast/UTI infections.  Discussed basic diet information re. DM as well.    We briefly discussed CGM technology and she is interested stating "this was a wake up call".  Requested benefits check for both insulin and CGM.  She has been on GLP-1 before but states that "they did not seem to work".  Recommend restarting basal insulin at discharge and possibly Novolog correction.  She will need to f/u with PCP asap.  Note that she does not have PCP so TOC consult placed for assistance.    Will follow up with patient this afternoon regarding plan for DM management.   Thanks,   Lorenza Cambridge, RN, BC-ADM Inpatient Diabetes Coordinator Pager [phone]  (8a-5p)

## 2023-07-22 NOTE — Telephone Encounter (Signed)
 Pharmacy Patient Advocate Encounter   Received notification that prior authorization for Dexcom G7 Sensor is required/requested.   Insurance verification completed.   The patient is insured through [employer] .   Per test claim: PA required; PA submitted to above mentioned insurance via CoverMyMeds Key/confirmation #/EOC [id number] Status is pending

## 2023-07-22 NOTE — Progress Notes (Signed)
 PROGRESS NOTE    Katherine Blair  WJX:[id number] DOB: Jan 17, 1966 DOA: 07/20/2023 PCP: Felix Pacini, FNP  Outpatient Specialists:     Brief Narrative:  Patient is a 58 year old female with past medical history significant for COPD, diabetes mellitus type 2, hypertension, GERD and penicillin allergy.  Due to lack of insurance, patient has not seek medical care in the last 1 year.  Patient also has low back pain.  Patient was admitted with community-acquired pneumonia (right middle lobe), with documented sepsis on presentation.  Patient has over 40 pack years.  According to the patient, she has had productive cough for over 3 weeks.  Cough is productive of yellowish and occasionally whitish sputum.  No clear fever or chills.  There is also reported numbness around the lateral aspect of left lower leg, but none improved.  As documented above, patient is a diabetic.  Patient is currently on IV Rocephin and azithromycin.  07/21/2023: Patient seen alongside patient's daughter.  Above documentation noted.  Will check A1c. 07/22/2023: A1c of 14.3%.  Patient is on subcutaneous Lantus and sliding scale coverage.  Concerns for possible checking will follow.  Speech consulted.  GI team also consulted.  Patient has history of GERD/Barrett's esophagus.  GI team plans to see patient on outpatient basis for now.  Continue Protonix 40 Mg p.o. twice daily.  No other constitutional symptoms endorsed.  Patient is not eager on being discharged back home today.  Likely discharge back home on oral antibiotics tomorrow.  Follow GI team on discharge.  Last EGD may have been in 2021.   Assessment & Plan:   Principal Problem:   Sepsis due to pneumonia [facility]) Active Problems:   Diabetes (HCC)   GAD (generalized anxiety disorder)   Tobacco dependence   Barrett's esophagus with high grade dysplasia   RSV (respiratory syncytial virus pneumonia)   Numbness and tingling of left leg   Ventricular  bigeminy   Community-acquired pneumonia: Possible sepsis documented on presentation: Positive RSV: -Sepsis physiology has resolved. -Continue IV Rocephin and azithromycin. -Follow cultures. -Likely discharge back home tomorrow on oral antibiotics. -Speech evaluation.  Diabetes mellitus: -Uncontrolled. -A1c of 14.3%.   -Consult diabetes coordinator. -Patient has not had care in over a year due to lack of insurance. -Start insulin.  Tobacco use disorder: -Patient has had over 40 pack years. -Counseled to quit. -Follow repeat chest x-ray in 4 to 6 weeks.  Barrett's esophagus with high-grade dysplasia: -GI follow-up on discharge. -Patient will likely need EGD. -Continue IV Protonix. -Low threshold to add Carafate. 07/22/2023: GI team consulted.  GI team prefers to follow patient up on outpatient basis.  Numbness and tingling of lateral aspect of left lower leg: -Improved significantly. -Further management as per patient's PCP.Marland Kitchen  Upper airway cough syndrome: -Start Flonase and Zyrtec. 07/22/2023: Cough has improved significantly with the above.   DVT prophylaxis: Cutaneous heparin. Code Status: Full code. Family Communication: Seen alongside daughter. Disposition Plan: Patient remains inpatient.   Consultants:  None.  Procedures:  None.  Antimicrobials:  IV Rocephin. IV azithromycin.   Subjective: -No fever and chills. -Query possible choking with food today. -Cough has improved. -No fever or chills.  Objective: Vitals:   07/22/23 0935 07/22/23 1344 07/22/23 1755 07/22/23 2018  BP: 102/72 131/81 131/72   Pulse: 74 78 76 80  Resp: 15 20 (!) 23 20  Temp: 98.5 F (36.9 C) 98.8 F (37.1 C) 98.3 F (36.8 C)   TempSrc: Oral Oral Oral   SpO2: 91% 95%  94%   Weight:      Height:        Intake/Output Summary (Last 24 hours) at 07/22/2023 2057 Last data filed at 07/22/2023 1506 Gross per 24 hour  Intake 703 ml  Output --  Net 703 ml   Filed Weights    07/20/23 1133 07/21/23 0333  Weight: 67.1 kg 69.5 kg    Examination:  General exam: Appears calm and comfortable  Respiratory system: Clear to auscultation.  Cardiovascular system: S1 & S2 heard Gastrointestinal system: Abdomen is soft and nontender. Central nervous system: Awake and alert.  Moves all extremities.  Extremities: No leg edema.  Data Reviewed: I have personally reviewed following labs and imaging studies  CBC: Recent Labs  Lab 07/20/23 1145 07/20/23 1832 07/21/23 0311 07/22/23 0336  WBC 14.2* 17.0* 13.4* 10.6*  NEUTROABS 9.9*  --   --  6.3  HGB 16.2* 15.5* 14.4 13.9  HCT 47.8* 45.9 42.9 41.7  MCV 81.7 82.3 83.3 83.6  PLT 246 224 200 190   Basic Metabolic Panel: Recent Labs  Lab 07/20/23 1145 07/20/23 1832 07/21/23 0311 07/22/23 0336  NA 135  --  133* 135  K 3.7  --  3.8 3.9  CL 97*  --  102 103  CO2 28  --  23 27  GLUCOSE 439*  --  359* 243*  BUN 8  --  8 7  CREATININE 0.43* 0.38* 0.41* 0.39*  CALCIUM 9.1  --  8.0* 8.5*  MG  --   --  1.5* 1.5*  PHOS  --   --   --  3.7   GFR: Estimated Creatinine Clearance: 71.8 mL/min (A) (by C-G formula based on SCr of 0.39 mg/dL (L)). Liver Function Tests: Recent Labs  Lab 07/20/23 1145 07/21/23 0311 07/22/23 0336  AST 11* 14*  --   ALT 13 13  --   ALKPHOS 103 74  --   BILITOT 0.4 0.3  --   PROT 7.1 5.7*  --   ALBUMIN 3.7 2.5* 2.6*   No results for input(s): "LIPASE", "AMYLASE" in the last 168 hours. No results for input(s): "AMMONIA" in the last 168 hours. Coagulation Profile: No results for input(s): "INR", "PROTIME" in the last 168 hours. Cardiac Enzymes: Recent Labs  Lab 07/20/23 1622  CKTOTAL 42   BNP (last 3 results) No results for input(s): "PROBNP" in the last 8760 hours. HbA1C: Recent Labs    07/20/23 1832 07/21/23 1208  HGBA1C 14.9* 14.3*   CBG: Recent Labs  Lab 07/21/23 1647 07/21/23 2108 07/22/23 0605 07/22/23 1235 07/22/23 1753  GLUCAP 222* 234* 225* 210* 143*    Lipid Profile: Recent Labs    07/22/23 0336  CHOL 149  HDL 29*  LDLCALC 66  TRIG 161*  CHOLHDL 5.1   Thyroid Function Tests: No results for input(s): "TSH", "T4TOTAL", "FREET4", "T3FREE", "THYROIDAB" in the last 72 hours. Anemia Panel: No results for input(s): "VITAMINB12", "FOLATE", "FERRITIN", "TIBC", "IRON", "RETICCTPCT" in the last 72 hours. Urine analysis:    Component Value Date/Time   COLORURINE YELLOW 07/22/2023 1128   APPEARANCEUR CLEAR 07/22/2023 1128   LABSPEC 1.015 07/22/2023 1128   PHURINE 7.0 07/22/2023 1128   GLUCOSEU NEGATIVE 07/22/2023 1128   HGBUR NEGATIVE 07/22/2023 1128   BILIRUBINUR NEGATIVE 07/22/2023 1128   KETONESUR NEGATIVE 07/22/2023 1128   PROTEINUR NEGATIVE 07/22/2023 1128   UROBILINOGEN 0.2 12/17/2009 1907   NITRITE NEGATIVE 07/22/2023 1128   LEUKOCYTESUR NEGATIVE 07/22/2023 1128   Sepsis Labs: @LABRCNTIP (procalcitonin:4,lacticidven:4)  ) Recent  Results (from the past 240 hours)  Blood culture (routine x 2)     Status: None (Preliminary result)   Collection Time: 07/20/23 11:47 AM   Specimen: BLOOD  Result Value Ref Range Status   Specimen Description   Final    BLOOD RIGHT ANTECUBITAL Performed at [facility], [address] [place], [place] [address]    Special Requests   Final    NONE Performed at [facility], [address], [place], [place] [address]    Culture   Final    NO GROWTH 2 DAYS Performed at [facility], [address], [place], [place] [address]    Report Status PENDING  Incomplete  Resp panel by RT-PCR (RSV, Flu A&B, Covid) Anterior Nasal Swab     Status: Abnormal   Collection Time: 07/20/23 12:08 PM   Specimen: Anterior Nasal Swab  Result Value Ref Range Status   SARS Coronavirus 2 by RT PCR NEGATIVE NEGATIVE Final    Comment: (NOTE) SARS-CoV-2 target nucleic acids are NOT DETECTED.  The SARS-CoV-2 RNA is generally detectable in upper respiratory specimens during the  acute phase of infection. The lowest concentration of SARS-CoV-2 viral copies this assay can detect is 138 copies/mL. A negative result does not preclude SARS-Cov-2 infection and should not be used as the sole basis for treatment or other patient management decisions. A negative result may occur with  improper specimen collection/handling, submission of specimen other than nasopharyngeal swab, presence of viral mutation(s) within the areas targeted by this assay, and inadequate number of viral copies(<138 copies/mL). A negative result must be combined with clinical observations, patient history, and epidemiological information. The expected result is Negative.  Fact Sheet for Patients:  BloggerCourse.com  Fact Sheet for Healthcare Providers:  SeriousBroker.it  This test is no t yet approved or cleared by the [place] FDA and  has been authorized for detection and/or diagnosis of SARS-CoV-2 by FDA under an Emergency Use Authorization (EUA). This EUA will remain  in effect (meaning this test can be used) for the duration of the COVID-19 declaration under Section 564(b)(1) of the Act, 21 U.S.C.section 360bbb-3(b)(1), unless the authorization is terminated  or revoked sooner.       Influenza A by PCR NEGATIVE NEGATIVE Final   Influenza B by PCR NEGATIVE NEGATIVE Final    Comment: (NOTE) The Xpert Xpress SARS-CoV-2/FLU/RSV plus assay is intended as an aid in the diagnosis of influenza from Nasopharyngeal swab specimens and should not be used as a sole basis for treatment. Nasal washings and aspirates are unacceptable for Xpert Xpress SARS-CoV-2/FLU/RSV testing.  Fact Sheet for Patients: BloggerCourse.com  Fact Sheet for Healthcare Providers: SeriousBroker.it  This test is not yet approved or cleared by the [place] FDA and has been authorized for detection and/or  diagnosis of SARS-CoV-2 by FDA under an Emergency Use Authorization (EUA). This EUA will remain in effect (meaning this test can be used) for the duration of the COVID-19 declaration under Section 564(b)(1) of the Act, 21 U.S.C. section 360bbb-3(b)(1), unless the authorization is terminated or revoked.     Resp Syncytial Virus by PCR POSITIVE (A) NEGATIVE Final    Comment: (NOTE) Fact Sheet for Patients: BloggerCourse.com  Fact Sheet for Healthcare Providers: SeriousBroker.it  This test is not yet approved or cleared by the [place] FDA and has been authorized for detection and/or diagnosis of SARS-CoV-2 by FDA under an Emergency Use Authorization (EUA). This EUA will remain in effect (meaning this  test can be used) for the duration of the COVID-19 declaration under Section 564(b)(1) of the Act, 21 U.S.C. section 360bbb-3(b)(1), unless the authorization is terminated or revoked.  Performed at [facility], [address] [place], [place] [address]   Blood culture (routine x 2)     Status: None (Preliminary result)   Collection Time: 07/20/23  4:22 PM   Specimen: BLOOD LEFT ARM  Result Value Ref Range Status   Specimen Description BLOOD LEFT ARM  Final   Special Requests   Final    AEROBIC BOTTLE ONLY Blood Culture results may not be optimal due to an inadequate volume of blood received in culture bottles   Culture   Final    NO GROWTH 2 DAYS Performed at [facility], [address] [place], [place] [address]    Report Status PENDING  Incomplete         Radiology Studies: No results found.       Scheduled Meds:  atorvastatin  20 mg Oral QHS   [START ON 07/23/2023] azithromycin  500 mg Oral Daily   budesonide (PULMICORT) nebulizer solution  0.25 mg Nebulization BID   fluticasone  2 spray Each Nare Daily   heparin  5,000 Units Subcutaneous Q8H   insulin aspart  0-15 Units  Subcutaneous TID WC   insulin glargine  15 Units Subcutaneous Daily   loratadine  10 mg Oral Daily   nicotine  21 mg Transdermal Daily   pantoprazole  40 mg Oral BID   sertraline  100 mg Oral Daily   sodium chloride flush  3 mL Intravenous Q12H   sodium chloride flush  3 mL Intravenous Q12H   Continuous Infusions:  cefTRIAXone (ROCEPHIN)  IV Stopped (07/21/23 2128)     LOS: 2 days    Time spent: 35 minutes.    Berton Mount, MD  [facility] Pager #: [phone] 7PM-7AM contact night coverage as above

## 2023-07-23 ENCOUNTER — Inpatient Hospital Stay (HOSPITAL_COMMUNITY)

## 2023-07-23 DIAGNOSIS — A419 Sepsis, unspecified organism: Secondary | ICD-10-CM | POA: Diagnosis not present

## 2023-07-23 DIAGNOSIS — J189 Pneumonia, unspecified organism: Secondary | ICD-10-CM | POA: Diagnosis not present

## 2023-07-23 LAB — GLUCOSE, CAPILLARY
Glucose-Capillary: 229 mg/dL — ABNORMAL HIGH (ref 70–99)
Glucose-Capillary: 158 mg/dL — ABNORMAL HIGH (ref 70–99)
Glucose-Capillary: 211 mg/dL — ABNORMAL HIGH (ref 70–99)
Glucose-Capillary: 184 mg/dL — ABNORMAL HIGH (ref 70–99)
Glucose-Capillary: 179 mg/dL — ABNORMAL HIGH (ref 70–99)

## 2023-07-23 NOTE — Inpatient Diabetes Management (Signed)
 Inpatient Diabetes Program Recommendations  AACE/ADA: New Consensus Statement on Inpatient Glycemic Control (2015)  Target Ranges:  Prepandial:   less than 140 mg/dL      Peak postprandial:   less than 180 mg/dL (1-2 hours)      Critically ill patients:  140 - 180 mg/dL   Lab Results  Component Value Date   GLUCAP 158 (H) 07/23/2023   HGBA1C 14.3 (H) 07/21/2023    Review of Glycemic Control  Latest Reference Range & Units 07/21/23 21:08 07/22/23 06:05 07/22/23 12:35 07/22/23 17:53 07/22/23 21:25 07/23/23 06:04 07/23/23 12:11  Glucose-Capillary 70 - 99 mg/dL 161 (H) 096 (H) 045 (H) 143 (H) 188 (H) 229 (H) 158 (H)  Diabetes history: DM 2 Outpatient Diabetes medications: None Current orders for Inpatient glycemic control:  Novolog 0-15 units tid with meals Lantus 15 units daily Inpatient Diabetes Program Recommendations:    Consider increasing Lantus to 20 units daily.   Met with patient and gave her 2 sample CGM's for home.  Dexcom was not approved however she can buy FSL for 75$ per month at retail pharmacy.  Explained this to patient and assisted her in downloading app to her phone.  Reviewed how to use but did not place due to patient going for MRI.  Discussed goal blood sugars, importance of close f/u and hypoglycemia treatment.  Patient appreciative of information.    Thanks,  Lorenza Cambridge, RN, BC-ADM Inpatient Diabetes Coordinator Pager [phone]  (8a-5p)

## 2023-07-23 NOTE — TOC Initial Note (Signed)
 Transition of Care (TOC) - Initial/Assessment Note  Donn Pierini RN, BSN Transitions of Care Unit 4E- RN Case Manager See Treatment Team for direct phone #   Patient Details  Name: Katherine Blair MRN: [id number] Date of Birth: Nov 26, 1965  Transition of Care [facility]) CM/SW Contact:    Darrold Span, RN Phone Number: 07/23/2023, 1:57 PM  Clinical Narrative:                 Referral received for PCP needs.  CM in to speak with pt at beside- per pt she has not seen listed PCP- [place] getting her new insurance and is not sure the [employer] is -in-network. Pt voiced she needs to make sure her PCP is in-network with her [place] plan. Discussed with pt the best way to make sure provider is in-network is to look on insurance online portal or call toll free number to check. Pt voiced she would plan to make calls tomorrow or get on the portal to see if [facility] is in-network or find a new provider. CM offered to make f/u appointment however pt would still need to check to make sure provider was in-network. Pt declined offer and voiced she would follow up on PCP needs and schedule appointment.   No further TOC needs noted at this time, DM educator has see pt regarding medications needs, etc for diabetes.   Expected Discharge Plan: Home/Self Care Barriers to Discharge: Continued Medical Work up   Patient Goals and CMS Choice Patient states their goals for this hospitalization and ongoing recovery are:: return home and manage health   Choice offered to / list presented to : NA      Expected Discharge Plan and Services   Discharge Planning Services: CM Consult, Other - See comment (PCP needs) Post Acute Care Choice: NA Living arrangements for the past 2 months: Single Family Home                 DME Arranged: N/A DME Agency: NA       HH Arranged: NA HH Agency: NA        Prior Living Arrangements/Services Living arrangements for the past 2 months: Single Family  Home Lives with:: Self Patient language and need for interpreter reviewed:: Yes Do you feel safe going back to the place where you live?: Yes      Need for Family Participation in Patient Care: Yes (Comment) Care giver support system in place?: Yes (comment)   Criminal Activity/Legal Involvement Pertinent to Current Situation/Hospitalization: No - Comment as needed  Activities of Daily Living      Permission Sought/Granted                  Emotional Assessment Appearance:: Appears stated age Attitude/Demeanor/Rapport: Engaged Affect (typically observed): Accepting, Pleasant Orientation: : Oriented to Self, Oriented to Place, Oriented to  Time, Oriented to Situation Alcohol / Substance Use: Not Applicable Psych Involvement: No (comment)  Admission diagnosis:  Weakness [R53.1] Sepsis (HCC) [A41.9] Community acquired pneumonia of right middle lobe of lung [J18.9] Sepsis, due to unspecified organism, unspecified whether acute organ dysfunction present [facility]) [A41.9] Patient Active Problem List   Diagnosis Date Noted   Sepsis due to pneumonia (HCC) 07/20/2023   Tobacco dependence 07/20/2023   RSV (respiratory syncytial virus pneumonia) 07/20/2023   Numbness and tingling of left leg 07/20/2023   Ventricular bigeminy 07/20/2023   GAD (generalized anxiety disorder) 02/08/2022   MDD (major depressive disorder), recurrent episode, moderate (HCC) 01/09/2022  H/O adrenal disorder 05/27/2019   Barrett's esophagus with high grade dysplasia 04/16/2019   Diabetes (HCC) 03/28/2019   PCP:  Felix Pacini, FNP Pharmacy:   [facility] [phone] - [place], [place] - [address] [place] [address] Phone: [phone] Fax: [phone]     Social Drivers of Health (SDOH) Social History: SDOH Screenings   Food Insecurity: Food Insecurity Present (07/20/2023)  Housing: Unknown (07/20/2023)  Transportation Needs: No  Transportation Needs (07/20/2023)  Utilities: Not At Risk (07/20/2023)  Depression (PHQ2-9): High Risk (12/14/2021)  Social Connections: Unknown (09/09/2021)   [facility] from [facility]  Tobacco Use: High Risk (07/20/2023)   SDOH Interventions:     Readmission Risk Interventions     No data to display

## 2023-07-23 NOTE — Telephone Encounter (Signed)
 Pharmacy Patient Advocate Encounter  Received notification from [employer] that Prior Authorization for Dexcom G7 Sensor  has been DENIED.  Full denial letter will be uploaded to the media tab. See denial reason below. There is no indication your patient is on any of the following insulin regimens: 1. multiple  daily injections; 2. long-acting basal insulin (for example, glargine, detemir, degludec, NPH);  or 3. continuous subcutaneous external insulin pump. ? Dexcom G7 sensors and receivers are considered medically necessary for the  management of type 1 or type 2 diabetes mellitus when the patient is age 90 years or older  and on any of the following insulin regimens: 1. multiple daily injections; 2. long-acting basal  insulin (for example, glargine, detemir, degludec, NPH); or 3. continuous subcutaneous  external insulin pump.  PA #/Case ID/Reference #: [id number]

## 2023-07-23 NOTE — Progress Notes (Signed)
 Progress Note   Patient: Katherine Blair ZOX:[id number] DOB: 11-02-1965 DOA: 07/20/2023     3 DOS: the patient was seen and examined on 07/23/2023   Brief hospital course: Patient is a 58 year old female with past medical history significant for COPD, diabetes mellitus type 2, hypertension, GERD and penicillin allergy.  Due to lack of insurance, patient has not seek medical care in the last 1 year.  Patient also has low back pain.  Patient was admitted with community-acquired pneumonia (right middle lobe), with documented sepsis on presentation.  Patient has over 40 pack years.  According to the patient, she has had productive cough for over 3 weeks.  Cough is productive of yellowish and occasionally whitish sputum.  No clear fever or chills.  There is also reported numbness around the lateral aspect of left lower leg, but none improved.  As documented above, patient is a diabetic.  Patient is currently on IV Rocephin and azithromycin.   07/21/2023: Patient seen alongside patient's daughter.  Above documentation noted.  Will check A1c. 07/22/2023: A1c of 14.3%.  Patient is on subcutaneous Lantus and sliding scale coverage.  Concerns for possible checking will follow.  Speech consulted.  GI team also consulted.  Patient has history of GERD/Barrett's esophagus.  GI team plans to see patient on outpatient basis for now.  Continue Protonix 40 Mg p.o. twice daily.  No other constitutional symptoms endorsed.  Patient is not eager on being discharged back home today.  Likely discharge back home on oral antibiotics tomorrow.  Follow GI team on discharge.  Last EGD may have been in 2021.  Assessment and Plan: * Sepsis due to pneumonia (HCC)  Cont IV abx coverage with rocephin and azithromycin.   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   Numbness and tingling of left leg Xray of knee. D/D include peroneal nerve compression.   RSV (respiratory  syncytial virus pneumonia) Supportive care. PRN albuterol.   Barrett's esophagus with high grade dysplasia PO PPI q12h. Pt needs GI f/u.   Tobacco dependence Nicotine patch.   GAD (generalized anxiety disorder) Continue sertraline.   Diabetes (HCC) A1c/ glycemic protocol.       Subjective: Pt seen and examined at the bedside. She still advised that she has some numbness in the L leg. Lumbar spine is ordered but as of 521 PM is still not completed. Likely will discharge the pt tmr once the lumbar MRI is completed. Continue antibx and breathing tx.  Physical Exam: Vitals:   07/23/23 0341 07/23/23 0807 07/23/23 1214 07/23/23 1630  BP: 131/78 117/66 131/78 (P) 126/79  Pulse: 81 82 84   Resp: 19 19 17    Temp: 98.4 F (36.9 C) 98.5 F (36.9 C) 98.5 F (36.9 C) (P) 98.5 F (36.9 C)  TempSrc: Oral Oral Oral   SpO2: 90% 94% 94%   Weight: 70.5 kg     Height:       Physical Exam HENT:     Head: Normocephalic.     Mouth/Throat:     Mouth: Mucous membranes are moist.  Cardiovascular:     Rate and Rhythm: Normal rate and regular rhythm.  Pulmonary:     Effort: Pulmonary effort is normal.  Abdominal:     Palpations: Abdomen is soft.  Musculoskeletal:        General: Normal range of motion.     Cervical back: Neck supple.  Skin:    General: Skin is warm.  Neurological:  Mental Status: She is alert. Mental status is at baseline.  Psychiatric:        Mood and Affect: Mood normal.      Disposition: Status is: Inpatient Remains inpatient appropriate because: awaiting lumbar MRI   Planned Discharge Destination: Home    Time spent: 35 minutes  Author: Baron Hamper , MD 07/23/2023 5:20 PM  For on call review [email or web address].

## 2023-07-24 ENCOUNTER — Other Ambulatory Visit (HOSPITAL_COMMUNITY): Payer: Self-pay

## 2023-07-24 DIAGNOSIS — A419 Sepsis, unspecified organism: Secondary | ICD-10-CM | POA: Diagnosis not present

## 2023-07-24 DIAGNOSIS — J189 Pneumonia, unspecified organism: Secondary | ICD-10-CM | POA: Diagnosis not present

## 2023-07-24 LAB — GLUCOSE, CAPILLARY
Glucose-Capillary: 184 mg/dL — ABNORMAL HIGH (ref 70–99)
Glucose-Capillary: 203 mg/dL — ABNORMAL HIGH (ref 70–99)

## 2023-07-24 LAB — COMPREHENSIVE METABOLIC PANEL
ALT: 18 U/L (ref 0–44)
AST: 20 U/L (ref 15–41)
Albumin: 2.8 g/dL — ABNORMAL LOW (ref 3.5–5.0)
Alkaline Phosphatase: 78 U/L (ref 38–126)
Anion gap: 8 (ref 5–15)
BUN: 10 mg/dL (ref 6–20)
CO2: 28 mmol/L (ref 22–32)
Calcium: 9.1 mg/dL (ref 8.9–10.3)
Chloride: 102 mmol/L (ref 98–111)
Creatinine, Ser: 0.38 mg/dL — ABNORMAL LOW (ref 0.44–1.00)
GFR, Estimated: >60 mL/min (ref >60)
Glucose, Bld: 171 mg/dL — ABNORMAL HIGH (ref 70–99)
Potassium: 3.7 mmol/L (ref 3.5–5.1)
Sodium: 138 mmol/L (ref 135–145)
Total Bilirubin: 0.4 mg/dL (ref 0.0–1.2)
Total Protein: 6.7 g/dL (ref 6.5–8.1)

## 2023-07-24 LAB — MICROALBUMIN / CREATININE URINE RATIO
Creatinine, Urine: 42.2 mg/dL
Microalb Creat Ratio: 19 mg/g{creat} (ref 0–29)
Microalb, Ur: 8 ug/mL — ABNORMAL HIGH

## 2023-07-24 LAB — MAGNESIUM: Magnesium: 1.7 mg/dL (ref 1.7–2.4)

## 2023-07-24 MED ORDER — FLUTICASONE PROPIONATE 50 MCG/ACT NA SUSP
2.0000 | Freq: Every day | NASAL | 0 refills | Status: AC
  Filled 2023-07-24: qty 16, 30d supply, fill #0

## 2023-07-24 MED ORDER — ALBUTEROL SULFATE HFA 108 (90 BASE) MCG/ACT IN AERS
2.0000 | INHALATION_SPRAY | Freq: Four times a day (QID) | RESPIRATORY_TRACT | 0 refills | Status: DC | PRN
  Filled 2023-07-24: qty 6.7, 25d supply, fill #0

## 2023-07-24 MED ORDER — PANTOPRAZOLE SODIUM 40 MG PO TBEC
40.0000 mg | DELAYED_RELEASE_TABLET | Freq: Two times a day (BID) | ORAL | 0 refills | Status: AC
  Filled 2023-07-24: qty 60, 30d supply, fill #0

## 2023-07-24 MED ORDER — INSULIN LISPRO (1 UNIT DIAL) 100 UNIT/ML (KWIKPEN)
2.0000 [IU] | PEN_INJECTOR | Freq: Three times a day (TID) | SUBCUTANEOUS | 0 refills | Status: AC
  Filled 2023-07-24: qty 3, 30d supply, fill #0

## 2023-07-24 MED ORDER — INSULIN PEN NEEDLE 32G X 4 MM MISC
0 refills | Status: AC
  Filled 2023-07-24: qty 100, 30d supply, fill #0

## 2023-07-24 MED ORDER — GUAIFENESIN-DM 100-10 MG/5ML PO SYRP
5.0000 mL | ORAL_SOLUTION | ORAL | 0 refills | Status: DC | PRN
  Filled 2023-07-24: qty 118, 4d supply, fill #0

## 2023-07-24 MED ORDER — GABAPENTIN 300 MG PO CAPS
300.0000 mg | ORAL_CAPSULE | Freq: Every day | ORAL | 0 refills | Status: DC
  Filled 2023-07-24: qty 30, 30d supply, fill #0

## 2023-07-24 MED ORDER — DOXYCYCLINE HYCLATE 100 MG PO TABS
100.0000 mg | ORAL_TABLET | Freq: Two times a day (BID) | ORAL | 0 refills | Status: AC
  Filled 2023-07-24: qty 2, 1d supply, fill #0

## 2023-07-24 MED ORDER — BASAGLAR KWIKPEN 100 UNIT/ML ~~LOC~~ SOPN
15.0000 [IU] | PEN_INJECTOR | Freq: Every day | SUBCUTANEOUS | 0 refills | Status: AC
  Filled 2023-07-24: qty 3, 20d supply, fill #0

## 2023-07-24 NOTE — Discharge Summary (Signed)
 Physician Discharge Summary   Patient: Katherine Blair MRN: [id number] DOB: [birth year]  Admit date:     07/20/2023  Discharge date: 07/24/23  Discharge Physician: Baron Hamper    PCP: Felix Pacini, FNP   Hospital Course:  58 year old female with past medical history significant for COPD, diabetes mellitus type 2, hypertension, GERD and penicillin allergy.  Due to lack of insurance, patient has not seek medical care in the last 1 year.  Patient also has low back pain.  Patient was admitted with community-acquired pneumonia (right middle lobe), with documented sepsis on presentation.  Patient has over 40 pack years.  According to the patient, she has had productive cough for over 3 weeks.  Cough is productive of yellowish and occasionally whitish sputum.  No clear fever or chills.  There is also reported numbness around the lateral aspect of left lower leg, but none improved.  As documented above, patient is a diabetic.  Patient is currently on IV Rocephin and azithromycin. A1c of 14.3%.  Patient is on subcutaneous Lantus and sliding scale coverage.  Concerns for possible checking will follow.  Speech consulted.  GI team also consulted.  Patient has history of GERD/Barrett's esophagus.  GI team plans to see patient on outpatient basis for now.  Continue Protonix 40 Mg p.o. twice daily.      DISCHARGE MEDICATION: Allergies as of 07/24/2023       Reactions   Penicillins Rash        Medication List     TAKE these medications    albuterol 108 (90 Base) MCG/ACT inhaler Commonly known as: VENTOLIN HFA Inhale 2 puffs into the lungs every 6 (six) hours as needed for up to 5 days for wheezing or shortness of breath.   doxycycline 100 MG tablet Commonly known as: VIBRA-TABS Take 1 tablet (100 mg total) by mouth 2 (two) times daily for 1 day.   fluticasone 50 MCG/ACT nasal spray Commonly known as: FLONASE Place 2 sprays into both nostrils daily. Start taking on: July 25, 2023    gabapentin 300 MG capsule Commonly known as: Neurontin Take 1 capsule (300 mg total) by mouth at bedtime.   guaiFENesin-dextromethorphan 100-10 MG/5ML syrup Commonly known as: ROBITUSSIN DM Take 5 mLs by mouth every 4 (four) hours as needed for cough.        Follow-up Information     Felix Pacini, FNP. Schedule an appointment as soon as possible for a visit.   Specialty: Endocrinology Why: make a follow up appointment as soon as possible with a primary care provider for [facility] >14 Contact information: [address] [place] [address] [phone]                Discharge Exam: Filed Weights   07/21/23 0333 07/23/23 0341 07/24/23 0608  Weight: 69.5 kg 70.5 kg 66.6 kg   Physical Exam HENT:     Head: Normocephalic.     Mouth/Throat:     Mouth: Mucous membranes are moist.  Cardiovascular:     Rate and Rhythm: Normal rate and regular rhythm.  Pulmonary:     Effort: Pulmonary effort is normal.  Abdominal:     Palpations: Abdomen is soft.  Musculoskeletal:     Cervical back: Neck supple.  Skin:    General: Skin is warm.  Neurological:     Mental Status: She is alert. Mental status is at baseline.  Psychiatric:        Mood and Affect: Mood normal.  Condition at discharge: fair  The results of significant diagnostics from this hospitalization (including imaging, microbiology, ancillary and laboratory) are listed below for reference.   Imaging Studies: MR LUMBAR SPINE WO CONTRAST Result Date: 07/23/2023 CLINICAL DATA:  58 yo F w/ L leg numbness EXAM: MRI LUMBAR SPINE WITHOUT CONTRAST TECHNIQUE: Multiplanar, multisequence MR imaging of the lumbar spine was performed. No intravenous contrast was administered. COMPARISON:  None Available. FINDINGS: Segmentation:  Standard segmentation is assumed. Alignment:  No substantial sagittal subluxation. Vertebrae:  No fracture, evidence of discitis, or bone lesion. Conus medullaris and cauda equina: Conus  extends to the T12-L1 level. Conus and cauda equina appear normal. Paraspinal and other soft tissues: Negative. Disc levels: T12-L1: No significant disc protrusion, foraminal stenosis, or canal stenosis. L1-L2: No significant disc protrusion, foraminal stenosis, or canal stenosis. L2-L3: No significant disc protrusion, foraminal stenosis, or canal stenosis. L3-L4: No significant disc protrusion, foraminal stenosis, or canal stenosis. L4-L5: No significant disc protrusion, foraminal stenosis, or canal stenosis. L5-S1: No significant disc protrusion, foraminal stenosis, or canal stenosis. IMPRESSION: No significant canal or foraminal stenosis. Electronically Signed   By: Feliberto Harts M.D.   On: 07/23/2023 23:44   DG Knee 1-2 Views Left Result Date: 07/20/2023 CLINICAL DATA:  Left knee numbness, no known injury, initial encounter EXAM: LEFT KNEE - 2 VIEW COMPARISON:  None Available. FINDINGS: No evidence of fracture, dislocation, or joint effusion. No evidence of arthropathy or other focal bone abnormality. Soft tissues are unremarkable. IMPRESSION: No acute abnormality noted. Electronically Signed   By: Alcide Clever M.D.   On: 07/20/2023 21:39   CT Head Wo Contrast Result Date: 07/20/2023 CLINICAL DATA:  Neuro deficit, concern for stroke. EXAM: CT HEAD WITHOUT CONTRAST TECHNIQUE: Contiguous axial images were obtained from the base of the skull through the vertex without intravenous contrast. RADIATION DOSE REDUCTION: This exam was performed according to the departmental dose-optimization program which includes automated exposure control, adjustment of the mA and/or kV according to patient size and/or use of iterative reconstruction technique. COMPARISON:  None Available. FINDINGS: Brain: No acute intracranial hemorrhage. No CT evidence of acute infarct. No edema, mass effect, or midline shift. The basilar cisterns are patent. Ventricles: The ventricles are normal. Vascular: No hyperdense vessel or  unexpected calcification. Skull: No acute or aggressive finding. Orbits: Orbits are symmetric. Sinuses: Mucosal thickening involving the left frontal sinus outflow tract and left anterior ethmoid air cells. Other: Mastoid air cells are clear. IMPRESSION: No CT evidence of acute intracranial abnormality. Electronically Signed   By: Emily Filbert M.D.   On: 07/20/2023 14:09   DG Chest 2 View Result Date: 07/20/2023 CLINICAL DATA:  Cough and congestion. EXAM: CHEST - 2 VIEW COMPARISON:  02/16/2019 and CT chest 11/16/2011. FINDINGS: Trachea is midline. Heart size normal. Right middle lobe airspace consolidation. No pleural fluid. IMPRESSION: Right middle lobe pneumonia. Followup PA and lateral chest X-ray is recommended in 3-4 weeks following trial of antibiotic therapy to ensure resolution and exclude underlying malignancy. Electronically Signed   By: Leanna Battles M.D.   On: 07/20/2023 12:20    Microbiology: Results for orders placed or performed during the hospital encounter of 07/20/23  Blood culture (routine x 2)     Status: None (Preliminary result)   Collection Time: 07/20/23 11:47 AM   Specimen: BLOOD  Result Value Ref Range Status   Specimen Description   Final    BLOOD RIGHT ANTECUBITAL Performed at [facility], [address] [place], [place] [address]  Special Requests   Final    NONE Performed at [facility], [address] [place], [place] [address]    Culture   Final    NO GROWTH 4 DAYS Performed at [facility], [address] [place], [place] [address]    Report Status PENDING  Incomplete  Resp panel by RT-PCR (RSV, Flu A&B, Covid) Anterior Nasal Swab     Status: Abnormal   Collection Time: 07/20/23 12:08 PM   Specimen: Anterior Nasal Swab  Result Value Ref Range Status   SARS Coronavirus 2 by RT PCR NEGATIVE NEGATIVE Final    Comment: (NOTE) SARS-CoV-2 target nucleic acids are NOT DETECTED.  The SARS-CoV-2 RNA is generally  detectable in upper respiratory specimens during the acute phase of infection. The lowest concentration of SARS-CoV-2 viral copies this assay can detect is 138 copies/mL. A negative result does not preclude SARS-Cov-2 infection and should not be used as the sole basis for treatment or other patient management decisions. A negative result may occur with  improper specimen collection/handling, submission of specimen other than nasopharyngeal swab, presence of viral mutation(s) within the areas targeted by this assay, and inadequate number of viral copies(<138 copies/mL). A negative result must be combined with clinical observations, patient history, and epidemiological information. The expected result is Negative.  Fact Sheet for Patients:  BloggerCourse.com  Fact Sheet for Healthcare Providers:  SeriousBroker.it  This test is no t yet approved or cleared by the [place] FDA and  has been authorized for detection and/or diagnosis of SARS-CoV-2 by FDA under an Emergency Use Authorization (EUA). This EUA will remain  in effect (meaning this test can be used) for the duration of the COVID-19 declaration under Section 564(b)(1) of the Act, 21 U.S.C.section 360bbb-3(b)(1), unless the authorization is terminated  or revoked sooner.       Influenza A by PCR NEGATIVE NEGATIVE Final   Influenza B by PCR NEGATIVE NEGATIVE Final    Comment: (NOTE) The Xpert Xpress SARS-CoV-2/FLU/RSV plus assay is intended as an aid in the diagnosis of influenza from Nasopharyngeal swab specimens and should not be used as a sole basis for treatment. Nasal washings and aspirates are unacceptable for Xpert Xpress SARS-CoV-2/FLU/RSV testing.  Fact Sheet for Patients: BloggerCourse.com  Fact Sheet for Healthcare Providers: SeriousBroker.it  This test is not yet approved or cleared by the [place] FDA  and has been authorized for detection and/or diagnosis of SARS-CoV-2 by FDA under an Emergency Use Authorization (EUA). This EUA will remain in effect (meaning this test can be used) for the duration of the COVID-19 declaration under Section 564(b)(1) of the Act, 21 U.S.C. section 360bbb-3(b)(1), unless the authorization is terminated or revoked.     Resp Syncytial Virus by PCR POSITIVE (A) NEGATIVE Final    Comment: (NOTE) Fact Sheet for Patients: BloggerCourse.com  Fact Sheet for Healthcare Providers: SeriousBroker.it  This test is not yet approved or cleared by the [place] FDA and has been authorized for detection and/or diagnosis of SARS-CoV-2 by FDA under an Emergency Use Authorization (EUA). This EUA will remain in effect (meaning this test can be used) for the duration of the COVID-19 declaration under Section 564(b)(1) of the Act, 21 U.S.C. section 360bbb-3(b)(1), unless the authorization is terminated or revoked.  Performed at [facility], [address], [place], [place] [address]   Blood culture (routine x 2)     Status: None (Preliminary result)   Collection Time: 07/20/23  4:22 PM  Specimen: BLOOD LEFT ARM  Result Value Ref Range Status   Specimen Description BLOOD LEFT ARM  Final   Special Requests   Final    AEROBIC BOTTLE ONLY Blood Culture results may not be optimal due to an inadequate volume of blood received in culture bottles   Culture   Final    NO GROWTH 4 DAYS Performed at [facility], [address] [place], [place] [address]    Report Status PENDING  Incomplete    Labs: CBC: Recent Labs  Lab 07/20/23 1145 07/20/23 1832 07/21/23 0311 07/22/23 0336  WBC 14.2* 17.0* 13.4* 10.6*  NEUTROABS 9.9*  --   --  6.3  HGB 16.2* 15.5* 14.4 13.9  HCT 47.8* 45.9 42.9 41.7  MCV 81.7 82.3 83.3 83.6  PLT 246 224 200 190   Basic Metabolic Panel: Recent Labs  Lab  07/20/23 1145 07/20/23 1832 07/21/23 0311 07/22/23 0336 07/24/23 0330  NA 135  --  133* 135 138  K 3.7  --  3.8 3.9 3.7  CL 97*  --  102 103 102  CO2 28  --  23 27 28   GLUCOSE 439*  --  359* 243* 171*  BUN 8  --  8 7 10   CREATININE 0.43* 0.38* 0.41* 0.39* 0.38*  CALCIUM 9.1  --  8.0* 8.5* 9.1  MG  --   --  1.5* 1.5* 1.7  PHOS  --   --   --  3.7  --    Liver Function Tests: Recent Labs  Lab 07/20/23 1145 07/21/23 0311 07/22/23 0336 07/24/23 0330  AST 11* 14*  --  20  ALT 13 13  --  18  ALKPHOS 103 74  --  78  BILITOT 0.4 0.3  --  0.4  PROT 7.1 5.7*  --  6.7  ALBUMIN 3.7 2.5* 2.6* 2.8*   CBG: Recent Labs  Lab 07/23/23 1211 07/23/23 1629 07/23/23 1736 07/23/23 2121 07/24/23 0605  GLUCAP 158* 211* 184* 179* 184*    Discharge time spent: greater than 30 minutes.  Signed: Baron Hamper , MD [facility] 07/24/2023

## 2023-07-24 NOTE — Progress Notes (Signed)
 Mobility Specialist Progress Note:   07/24/23 0936  Mobility  Activity Ambulated with assistance in hallway;Ambulated with assistance in room  Level of Assistance Standby assist, set-up cues, supervision of patient - no hands on  Assistive Device None  Distance Ambulated (ft) 300 ft  Activity Response Tolerated well  Mobility Referral Yes  Mobility visit 1 Mobility  Mobility Specialist Start Time (ACUTE ONLY) [id number]  Mobility Specialist Stop Time (ACUTE ONLY) 0943  Mobility Specialist Time Calculation (min) (ACUTE ONLY) 7 min   Pt received in bed, agreeable to mobility session. Ambulated in hallway, no AD required. SBA for safety. Tolerated well, c/o L foot numbness. VSS throughout. Returned pt to room, all needs met, call bell in reach.   Feliciana Rossetti Mobility Specialist Please contact via Special educational needs teacher or  Rehab office at [phone]

## 2023-07-24 NOTE — Plan of Care (Signed)
  Problem: Education: Goal: Knowledge of General Education information will improve Description: Including pain rating scale, medication(s)/side effects and non-pharmacologic comfort measures Outcome: Adequate for Discharge   Problem: Health Behavior/Discharge Planning: Goal: Ability to manage health-related needs will improve Outcome: Adequate for Discharge   Problem: Clinical Measurements: Goal: Ability to maintain clinical measurements within normal limits will improve Outcome: Adequate for Discharge Goal: Will remain free from infection Outcome: Adequate for Discharge Goal: Diagnostic test results will improve Outcome: Adequate for Discharge Goal: Respiratory complications will improve Outcome: Adequate for Discharge Goal: Cardiovascular complication will be avoided Outcome: Adequate for Discharge   Problem: Activity: Goal: Risk for activity intolerance will decrease Outcome: Adequate for Discharge   Problem: Nutrition: Goal: Adequate nutrition will be maintained Outcome: Adequate for Discharge   Problem: Coping: Goal: Level of anxiety will decrease Outcome: Adequate for Discharge   Problem: Elimination: Goal: Will not experience complications related to bowel motility Outcome: Adequate for Discharge Goal: Will not experience complications related to urinary retention Outcome: Adequate for Discharge   Problem: Pain Managment: Goal: General experience of comfort will improve and/or be controlled Outcome: Adequate for Discharge   Problem: Safety: Goal: Ability to remain free from injury will improve Outcome: Adequate for Discharge   Problem: Skin Integrity: Goal: Risk for impaired skin integrity will decrease Outcome: Adequate for Discharge   Problem: Education: Goal: Ability to describe self-care measures that may prevent or decrease complications (Diabetes Survival Skills Education) will improve Outcome: Adequate for Discharge   Problem: Coping: Goal:  Ability to adjust to condition or change in health will improve Outcome: Adequate for Discharge   Problem: Fluid Volume: Goal: Ability to maintain a balanced intake and output will improve Outcome: Adequate for Discharge   Problem: Health Behavior/Discharge Planning: Goal: Ability to identify and utilize available resources and services will improve Outcome: Adequate for Discharge Goal: Ability to manage health-related needs will improve Outcome: Adequate for Discharge   Problem: Metabolic: Goal: Ability to maintain appropriate glucose levels will improve Outcome: Adequate for Discharge   Problem: Nutritional: Goal: Maintenance of adequate nutrition will improve Outcome: Adequate for Discharge Goal: Progress toward achieving an optimal weight will improve Outcome: Adequate for Discharge   Problem: Skin Integrity: Goal: Risk for impaired skin integrity will decrease Outcome: Adequate for Discharge   Problem: Tissue Perfusion: Goal: Adequacy of tissue perfusion will improve Outcome: Adequate for Discharge

## 2023-07-24 NOTE — Written Directive (Signed)
 07/24/2023  To whom it may concern,   Please note Katherine Blair (DOB: 1965-11-13) was admitted in the hospital from 07/20/2023 - 07/24/2023 for medical treatment.  She may return to work on Monday July 29 2023. If you have any questions or would like to discuss this matter further please call [phone].   Baron Hamper M.D. Medical Attending

## 2023-07-25 LAB — CULTURE, BLOOD (ROUTINE X 2): Culture: NO GROWTH

## 2023-10-02 ENCOUNTER — Encounter: Payer: Self-pay | Admitting: Gastroenterology

## 2023-10-02 ENCOUNTER — Ambulatory Visit (INDEPENDENT_AMBULATORY_CARE_PROVIDER_SITE_OTHER): Admitting: Gastroenterology

## 2023-10-02 ENCOUNTER — Other Ambulatory Visit (INDEPENDENT_AMBULATORY_CARE_PROVIDER_SITE_OTHER)

## 2023-10-02 VITALS — BP 122/72 | HR 93 | Ht 66.0 in | Wt 179.2 lb

## 2023-10-02 DIAGNOSIS — Z860100 Personal history of colon polyps, unspecified: Secondary | ICD-10-CM

## 2023-10-02 DIAGNOSIS — K219 Gastro-esophageal reflux disease without esophagitis: Secondary | ICD-10-CM

## 2023-10-02 DIAGNOSIS — K760 Fatty (change of) liver, not elsewhere classified: Secondary | ICD-10-CM

## 2023-10-02 DIAGNOSIS — K22711 Barrett's esophagus with high grade dysplasia: Secondary | ICD-10-CM | POA: Diagnosis not present

## 2023-10-02 DIAGNOSIS — R1310 Dysphagia, unspecified: Secondary | ICD-10-CM

## 2023-10-02 DIAGNOSIS — R1319 Other dysphagia: Secondary | ICD-10-CM

## 2023-10-02 LAB — COMPREHENSIVE METABOLIC PANEL WITH GFR
ALT: 17 U/L (ref 0–35)
AST: 14 U/L (ref 0–37)
Albumin: 4 g/dL (ref 3.5–5.2)
Alkaline Phosphatase: 86 U/L (ref 39–117)
BUN: 16 mg/dL (ref 6–23)
CO2: 27 meq/L (ref 19–32)
Calcium: 9.2 mg/dL (ref 8.4–10.5)
Chloride: 100 meq/L (ref 96–112)
Creatinine, Ser: 0.57 mg/dL (ref 0.40–1.20)
GFR: 100.02 mL/min (ref >60.00)
Glucose, Bld: 336 mg/dL — ABNORMAL HIGH (ref 70–99)
Potassium: 4.1 meq/L (ref 3.5–5.1)
Sodium: 135 meq/L (ref 135–145)
Total Bilirubin: 0.2 mg/dL (ref 0.2–1.2)
Total Protein: 7.3 g/dL (ref 6.0–8.3)

## 2023-10-02 LAB — PROTIME-INR
INR: 1 ratio (ref 0.8–1.0)
Prothrombin Time: 11 s (ref 9.6–13.1)

## 2023-10-02 LAB — CBC
HCT: 43.6 % (ref 36.0–46.0)
Hemoglobin: 14.4 g/dL (ref 12.0–15.0)
MCHC: 33 g/dL (ref 30.0–36.0)
MCV: 82 fl (ref 78.0–100.0)
Platelets: 288 10*3/uL (ref 150.0–400.0)
RBC: 5.32 Mil/uL — ABNORMAL HIGH (ref 3.87–5.11)
RDW: 13.6 % (ref 11.5–15.5)
WBC: 10 10*3/uL (ref 4.0–10.5)

## 2023-10-02 NOTE — Patient Instructions (Addendum)
 _______________________________________________________  If your blood pressure at your visit was 140/90 or greater, please contact your primary care physician to follow up on this. _______________________________________________________  If you are age 59 or younger, your body mass index should be between 19-25. Your Body mass index is 28.93 kg/m. If this is out of the aformentioned range listed, please consider follow up with your Primary Care Provider.  ________________________________________________________  The [facility] providers would like to encourage you to use MYCHART to communicate with providers for non-urgent requests or questions.  Due to long hold times on the telephone, sending your provider a message by [facility] may be a faster and more efficient way to get a response.  Please allow 48 business hours for a response.  Please remember that this is for non-urgent requests.  _______________________________________________________  Your provider has requested that you go to the basement level for lab work before leaving today. Press "B" on the elevator. The lab is located at the first door on the left as you exit the elevator.  You have been scheduled for an endoscopy. Please follow written instructions given to you at your visit today.  If you use inhalers (even only as needed), please bring them with you on the day of your procedure.  If you take any of the following medications, they will need to be adjusted prior to your procedure:   DO NOT TAKE 7 DAYS PRIOR TO TEST- Trulicity  (dulaglutide ) Ozempic, Wegovy (semaglutide) Mounjaro (tirzepatide) Bydureon Bcise (exanatide extended release)  DO NOT TAKE 1 DAY PRIOR TO YOUR TEST Rybelsus (semaglutide) Adlyxin (lixisenatide) Victoza (liraglutide) Byetta (exanatide) ___________________________________________________________________________  Due to recent changes in healthcare laws, you may see the results of your imaging  and laboratory studies on MyChart before your provider has had a chance to review them.  We understand that in some cases there may be results that are confusing or concerning to you. Not all laboratory results come back in the same time frame and the provider may be waiting for multiple results in order to interpret others.  Please give us  48 hours in order for your provider to thoroughly review all the results before contacting the office for clarification of your results.   It was a pleasure to see you today!  Vito Cirigliano, D.O.

## 2023-10-02 NOTE — Progress Notes (Signed)
 Chief Complaint: GERD, Barrett's Esophagus, hepatic steatosis, establish care   Referring Provider:     Janifer Meigs, FNP    HPI:     Katherine Blair is a 58 y.o. female with a history of COPD, poorly controlled diabetes, HTN, hypothyroidism, Cushing's syndrome, GERD, tobacco use (quit 06/2023 during hospital admission), referred to the Gastroenterology Clinic for evaluation of GERD with Barrett's Esophagus.  Long-standing history of GERD. Index sxs of heartburn, regurgitation. Worse at night. Previously treated with omeprazole, and now Protonix  40 mg BID with good response. Occasional solid food dysphagia, but she attributes that to poorly fitting denture and difficulty chewing.   Previously followed with [facility] and [facility].  Limited available prior notes reviewed.  Was diagnosed with Barrett's Esophagus many years ago in [place] .  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  Repeat upper endoscopy by Dr. Ardine Beckwith at [facility] in 2020 with C1 M2 with questionable nodule at GE junction (path showed Barrett's esophagus with inflammatory atypia).   Underwent repeat RFA with Dr. Rowena Copa at [facility] in 05/2019 and 07/2019 (path reports not available for review).  For reflux, currently treated with Protonix  40 mg twice daily.  Reports a history of hepatic steatosis.  Does have report from previous FibroScan from 10/2021 on her phone today which was reviewed and shows S3 F4 disease, but fib4 score 1.02.  CT from 11/2019 with diffuse fatty infiltration of the liver and hepatic cysts, but no evidence of portal hypertensive changes.  Most recent labs reviewed from 06/2023 (hospital admission for CAP): Albumin 2.8, BG 171, otherwise normal CMP.  WBC 10.6, otherwise normal CBC.  A1c 14.3%.  Has had several colonoscopies  in the past. Polyps in 2015. Unsure if polyps with repeat by Dr. Ardine Beckwith at [facility] in 2023.  No previous colonoscopy reports available for review today.  Father with Crohns Disease and polyps. No known family history of CRC, GI malignancy, liver disease, pancreatic disease.     Past Medical History:  Diagnosis Date   COPD (chronic obstructive pulmonary disease) (HCC)    Cushing's syndrome (HCC)    Diabetes mellitus without complication (HCC)    GERD (gastroesophageal reflux disease)    Hypertension    Thyroid  disease      Past Surgical History:  Procedure Laterality Date   BLADDER SURGERY     CESAREAN SECTION     DILATION AND CURETTAGE OF UTERUS     PARTIAL HYSTERECTOMY     TUBAL LIGATION     Family History  Problem Relation Age of Onset   Diabetes Mother    Bipolar disorder Father    Diabetes Father    Drug abuse Sister    Diabetes Sister    Social History   Tobacco Use   Smoking status: Former    Current packs/day: 1.00    Average packs/day: 1 pack/day for 30.0 years (30.0 ttl pk-yrs)    Types: Cigarettes   Smokeless tobacco: Never  Vaping Use   Vaping status: Never Used  Substance Use Topics   Alcohol use: Not Currently    Comment: social   Drug use: No   Current Outpatient Medications  Medication Sig Dispense Refill   fluticasone  (FLONASE ) 50 MCG/ACT nasal spray Place 2 sprays into both nostrils daily. 16 g 0  gabapentin  (NEURONTIN ) 600 MG tablet Take 600 mg by mouth 3 (three) times daily.     Insulin  Glargine (BASAGLAR  KWIKPEN) 100 UNIT/ML Inject 15 Units into the skin daily. (Patient taking differently: Inject 25 Units into the skin daily.) 6 mL 0   Insulin  Pen Needle 32G X 4 MM MISC Check sugars three times a day 100 each 0   pantoprazole  (PROTONIX ) 40 MG tablet Take 1 tablet (40 mg total) by mouth 2 (two) times daily. 60 tablet 0   guaiFENesin -dextromethorphan  (ROBITUSSIN DM) 100-10 MG/5ML syrup Take 5 mLs by mouth every 4 (four) hours as needed for cough.  (Patient not taking: Reported on 10/02/2023) 118 mL 0   insulin  lispro (HUMALOG  KWIKPEN) 100 UNIT/ML KwikPen Inject 2 Units into the skin 3 (three) times daily with meals. (Patient taking differently: Inject 25-50 Units into the skin 3 (three) times daily with meals.) 3 mL 0   No current facility-administered medications for this visit.   Allergies  Allergen Reactions   Penicillins Rash     Review of Systems: All systems reviewed and negative except where noted in HPI.     Physical Exam:    Wt Readings from Last 3 Encounters:  10/02/23 179 lb 4 oz (81.3 kg)  07/24/23 146 lb 13.2 oz (66.6 kg)  11/24/22 173 lb (78.5 kg)    BP 122/72   Pulse 93   Ht 5\' 6"  (1.676 m)   Wt 179 lb 4 oz (81.3 kg)   BMI 28.93 kg/m  Constitutional:  Pleasant, in no acute distress. Psychiatric: Normal mood and affect. Behavior is normal. Cardiovascular: Normal rate, regular rhythm. No edema Pulmonary/chest: Effort normal and breath sounds normal. No wheezing, rales or rhonchi. Abdominal: Soft, nondistended, nontender. Bowel sounds active throughout. There are no masses palpable. No hepatomegaly. Neurological: Alert and oriented to person place and time. Skin: Skin is warm and dry. No rashes noted.   ASSESSMENT AND PLAN;   1) GERD 2) Barrett's Esophagus with high-grade dysplasia s/p RFA x 2 - Obtain prior EGD reports from [facility] and [facility] - Schedule repeat EGD now for ongoing surveillance - Continue Protonix  40 mg twice daily given good symptomatic response with plan to adjust PPI therapy depending on endoscopic findings and symptomatology  3) Dysphagia - Evaluate for luminal pathology at time of EGD as above with esophageal dilation and/or biopsies as appropriate - May need to follow-up with dentist for poorly fitting dentures contributing to symptoms  4) Hepatic steatosis Does have hepatic steatosis by previous imaging.  Most recent liver enzymes otherwise normal.   Reviewed prior fib 4 score (1.02; normal) and FibroScan (S3, F4 disease) on her phone today. - Try to obtain prior records from [facility] - Check CMP, PT/INR and repeat CBC - Evaluate for portal hypertensive changes at time of EGD as above - Consider repeat FibroScan vs ultrasound elastography  5) History of colon polyps - Obtain prior colonoscopy reports from [facility].  If not made available, plan on repeat colonoscopy in 2028 for surveillance   The indications, risks, and benefits of EGD were explained to the patient in detail. Risks include but are not limited to bleeding, perforation, adverse reaction to medications, and cardiopulmonary compromise. Sequelae include but are not limited to the possibility of surgery, hospitalization, and mortality. The patient verbalized understanding and wished to proceed. All questions answered, referred to scheduler. Further recommendations pending results of the exam.     Annis Kinder, DO, FACG  10/02/2023,  2:09 PM   Abelardo Abernethy Janet Medico, *

## 2023-10-03 ENCOUNTER — Ambulatory Visit: Payer: Self-pay | Admitting: Gastroenterology

## 2023-10-09 ENCOUNTER — Ambulatory Visit (AMBULATORY_SURGERY_CENTER): Admitting: Gastroenterology

## 2023-10-09 ENCOUNTER — Encounter: Payer: Self-pay | Admitting: Gastroenterology

## 2023-10-09 VITALS — BP 128/53 | HR 61 | Temp 97.5°F | Resp 14 | Ht 66.0 in | Wt 179.0 lb

## 2023-10-09 DIAGNOSIS — K2950 Unspecified chronic gastritis without bleeding: Secondary | ICD-10-CM

## 2023-10-09 DIAGNOSIS — K219 Gastro-esophageal reflux disease without esophagitis: Secondary | ICD-10-CM

## 2023-10-09 DIAGNOSIS — K449 Diaphragmatic hernia without obstruction or gangrene: Secondary | ICD-10-CM

## 2023-10-09 DIAGNOSIS — K2970 Gastritis, unspecified, without bleeding: Secondary | ICD-10-CM

## 2023-10-09 DIAGNOSIS — K31A22 Gastric intestinal metaplasia with high grade dysplasia: Secondary | ICD-10-CM | POA: Diagnosis not present

## 2023-10-09 DIAGNOSIS — K319 Disease of stomach and duodenum, unspecified: Secondary | ICD-10-CM

## 2023-10-09 DIAGNOSIS — K22711 Barrett's esophagus with high grade dysplasia: Secondary | ICD-10-CM

## 2023-10-09 MED ORDER — SODIUM CHLORIDE 0.9 % IV SOLN: 500.0000 mL | INTRAVENOUS | Status: DC

## 2023-10-09 NOTE — Patient Instructions (Addendum)
 Educational handout provided to patient related to Gastritis & hiatal hernia  Resume previous diet  Continue present medications  Awaiting pathology results  YOU HAD AN ENDOSCOPIC PROCEDURE TODAY AT THE  ENDOSCOPY CENTER:   Refer to the procedure report that was given to you for any specific questions about what was found during the examination.  If the procedure report does not answer your questions, please call your gastroenterologist to clarify.  If you requested that your care partner not be given the details of your procedure findings, then the procedure report has been included in a sealed envelope for you to review at your convenience later.  YOU SHOULD EXPECT: Some feelings of bloating in the abdomen. Passage of more gas than usual.  Walking can help get rid of the air that was put into your GI tract during the procedure and reduce the bloating. If you had a lower endoscopy (such as a colonoscopy or flexible sigmoidoscopy) you may notice spotting of blood in your stool or on the toilet paper. If you underwent a bowel prep for your procedure, you may not have a normal bowel movement for a few days.  Please Note:  You might notice some irritation and congestion in your nose or some drainage.  This is from the oxygen used during your procedure.  There is no need for concern and it should clear up in a day or so.  SYMPTOMS TO REPORT IMMEDIATELY:  Following upper endoscopy (EGD)  Vomiting of blood or coffee ground material  New chest pain or pain under the shoulder blades  Painful or persistently difficult swallowing  New shortness of breath  Fever of 100F or higher  Black, tarry-looking stools  For urgent or emergent issues, a gastroenterologist can be reached at any hour by calling [phone]. Do not use MyChart messaging for urgent concerns.    DIET:  We do recommend a small meal at first, but then you may proceed to your regular diet.  Drink plenty of fluids but you  should avoid alcoholic beverages for 24 hours.  ACTIVITY:  You should plan to take it easy for the rest of today and you should NOT DRIVE or use heavy machinery until tomorrow (because of the sedation medicines used during the test).    FOLLOW UP: Our staff will call the number listed on your records the next business day following your procedure.  We will call around 7:15- 8:00 am to check on you and address any questions or concerns that you may have regarding the information given to you following your procedure. If we do not reach you, we will leave a message.     If any biopsies were taken you will be contacted by phone or by letter within the next 1-3 weeks.  Please call us  at [phone] if you have not heard about the biopsies in 3 weeks.    SIGNATURES/CONFIDENTIALITY: You and/or your care partner have signed paperwork which will be entered into your electronic medical record.  These signatures attest to the fact that that the information above on your After Visit Summary has been reviewed and is understood.  Full responsibility of the confidentiality of this discharge information lies with you and/or your care-partner.

## 2023-10-09 NOTE — Progress Notes (Signed)
 Sedate, gd SR, tolerated procedure well, VSS, report to RN

## 2023-10-09 NOTE — Progress Notes (Signed)
 Called to room to assist during endoscopic procedure.  Patient ID and intended procedure confirmed with present staff. Received instructions for my participation in the procedure from the performing physician.

## 2023-10-09 NOTE — Progress Notes (Signed)
 GASTROENTEROLOGY PROCEDURE H&P NOTE   Primary Care Physician: Berneda Bridges, MD    Reason for Procedure:  GERD, Barrett's esophagus, dysphagia  Plan:    EGD  Patient is appropriate for endoscopic procedure(s) in the ambulatory ([facility]) setting.  The nature of the procedure, as well as the risks, benefits, and alternatives were carefully and thoroughly reviewed with the patient. Ample time for discussion and questions allowed. The patient understood, was satisfied, and agreed to proceed.     HPI: Katherine Blair is a 58 y.o. female who presents for EGD for evaluation of GERD and history of Barrett's Esophagus with high-grade dysplasia with prior RFA x 2, along with intermittent solid food dysphagia.  Patient was most recently seen in the Gastroenterology Clinic on 10/02/2023 by me.  No interval change in medical history since that appointment. Please refer to that note for full details regarding GI history and clinical presentation.   Long-standing history of GERD. Index sxs of heartburn, regurgitation. Worse at night. Previously treated with omeprazole, and now Protonix  40 mg BID with good response. Occasional solid food dysphagia, but she attributes that to poorly fitting denture and difficulty chewing.    Previously followed with [facility] and [facility].  Limited available prior notes reviewed.  Was diagnosed with Barrett's Esophagus many years ago in [place] .  Had EGD in [place] approximately 2019 which was notable for long segment Barrett's Esophagus with high-grade dysplasia and underwent 2 sessions of RFA prior to moving to .  Repeat upper endoscopy by Dr. Ardine Beckwith at [facility] in 2020 with C1 M2 with questionable nodule at GE junction (path showed Barrett's esophagus with inflammatory atypia).   Underwent repeat RFA with Dr. Rowena Copa at [facility] in 05/2019 and 07/2019 (path reports not available for review).   For reflux, currently treated with Protonix  40  mg twice daily.  Past Medical History:  Diagnosis Date   COPD (chronic obstructive pulmonary disease) (HCC)    Cushing's syndrome (HCC)    Diabetes mellitus without complication (HCC)    GERD (gastroesophageal reflux disease)    Hypertension    Thyroid  disease     Past Surgical History:  Procedure Laterality Date   BLADDER SURGERY     CESAREAN SECTION     DILATION AND CURETTAGE OF UTERUS     PARTIAL HYSTERECTOMY     TUBAL LIGATION      Prior to Admission medications   Medication Sig Start Date End Date Taking? Authorizing Provider  gabapentin  (NEURONTIN ) 600 MG tablet Take 600 mg by mouth 3 (three) times daily.   Yes [provider]  Insulin  Glargine (BASAGLAR  KWIKPEN) 100 UNIT/ML Inject 15 Units into the skin daily. Patient taking differently: Inject 25 Units into the skin daily. 07/24/23 10/09/23 Yes Sira, Zackery, MD  insulin  lispro (HUMALOG  KWIKPEN) 100 UNIT/ML KwikPen Inject 2 Units into the skin 3 (three) times daily with meals. Patient taking differently: Inject 25-50 Units into the skin 3 (three) times daily with meals. 07/24/23 10/09/23 Yes Sira, Zackery, MD  Insulin  Pen Needle 32G X 4 MM MISC Check sugars three times a day 07/24/23  Yes Sira, Zackery, MD  pantoprazole  (PROTONIX ) 40 MG tablet Take 1 tablet (40 mg total) by mouth 2 (two) times daily. 07/24/23 10/09/23 Yes Sira, Zackery, MD  sertraline  (ZOLOFT ) 100 MG tablet Take 100 mg by mouth daily. 10/05/23  Yes [provider]  fluticasone  (FLONASE ) 50 MCG/ACT nasal spray Place 2 sprays into both nostrils daily. 07/25/23   Sira,  Ancil Balzarine, MD  guaiFENesin -dextromethorphan  (ROBITUSSIN DM) 100-10 MG/5ML syrup Take 5 mLs by mouth every 4 (four) hours as needed for cough. Patient not taking: Reported on 10/02/2023 07/24/23   Sira, Zackery, MD    Current Outpatient Medications  Medication Sig Dispense Refill   gabapentin  (NEURONTIN ) 600 MG tablet Take 600 mg by mouth 3 (three) times daily.     Insulin  Glargine  (BASAGLAR  KWIKPEN) 100 UNIT/ML Inject 15 Units into the skin daily. (Patient taking differently: Inject 25 Units into the skin daily.) 6 mL 0   insulin  lispro (HUMALOG  KWIKPEN) 100 UNIT/ML KwikPen Inject 2 Units into the skin 3 (three) times daily with meals. (Patient taking differently: Inject 25-50 Units into the skin 3 (three) times daily with meals.) 3 mL 0   Insulin  Pen Needle 32G X 4 MM MISC Check sugars three times a day 100 each 0   pantoprazole  (PROTONIX ) 40 MG tablet Take 1 tablet (40 mg total) by mouth 2 (two) times daily. 60 tablet 0   sertraline  (ZOLOFT ) 100 MG tablet Take 100 mg by mouth daily.     fluticasone  (FLONASE ) 50 MCG/ACT nasal spray Place 2 sprays into both nostrils daily. 16 g 0   guaiFENesin -dextromethorphan  (ROBITUSSIN DM) 100-10 MG/5ML syrup Take 5 mLs by mouth every 4 (four) hours as needed for cough. (Patient not taking: Reported on 10/02/2023) 118 mL 0   Current Facility-Administered Medications  Medication Dose Route Frequency Provider Last Rate Last Admin   0.9 %  sodium chloride  infusion  500 mL Intravenous Continuous Danyel Griess V, DO        Allergies as of 10/09/2023 - Review Complete 10/09/2023  Allergen Reaction Noted   Penicillins Rash 02/16/2019    Family History  Problem Relation Age of Onset   Diabetes Mother    Bipolar disorder Father    Diabetes Father    Drug abuse Sister    Diabetes Sister     Social History   Socioeconomic History   Marital status: Single    Spouse name: Not on file   Number of children: Not on file   Years of education: Not on file   Highest education level: Not on file  Occupational History   Not on file  Tobacco Use   Smoking status: Former    Current packs/day: 1.00    Average packs/day: 1 pack/day for 30.0 years (30.0 ttl pk-yrs)    Types: Cigarettes   Smokeless tobacco: Never  Vaping Use   Vaping status: Never Used  Substance and Sexual Activity   Alcohol use: Not Currently    Comment: social    Drug use: No   Sexual activity: Not on file  Other Topics Concern   Not on file  Social History Narrative   Not on file   Social Drivers of Health   Financial Resource Strain: Not on file  Food Insecurity: Food Insecurity Present (07/20/2023)   Hunger Vital Sign    Worried About Running Out of Food in the Last Year: Sometimes true    Ran Out of Food in the Last Year: Sometimes true  Transportation Needs: No Transportation Needs (07/20/2023)   PRAPARE - Administrator, Civil Service (Medical): No    Lack of Transportation (Non-Medical): No  Physical Activity: Not on file  Stress: Not on file  Social Connections: Unknown (09/09/2021)   Received from [facility]   Social Network    Social Network: Not on file  Intimate Partner Violence: Not At  Risk (07/20/2023)   Humiliation, Afraid, Rape, and Kick questionnaire    Fear of Current or Ex-Partner: No    Emotionally Abused: No    Physically Abused: No    Sexually Abused: No    Physical Exam: Vital signs in last 24 hours: @BP  (!) 148/84   Pulse 74   Temp (!) 97.5 F (36.4 C)   Ht 5' 6 (1.676 m)   Wt 179 lb (81.2 kg)   SpO2 96%   BMI 28.89 kg/m  GEN: NAD EYE: Sclerae anicteric ENT: MMM CV: Non-tachycardic Pulm: CTA b/l GI: Soft, NT/ND NEURO:  Alert & Oriented x 3   Harry Lindau, DO [facility]   10/09/2023 9:49 AM

## 2023-10-09 NOTE — Op Note (Signed)
 [facility] Patient Name: Katherine Blair Procedure Date: 10/09/2023 9:51 AM MRN: [id number] Endoscopist: Harry Lindau , MD, [id number] Age: 58 Referring MD:  Date of Birth: [birth year] Gender: Female Account #: [id number] Procedure:                Upper GI endoscopy Indications:              Esophageal reflux, Follow-up of Barrett's esophagus Medicines:                Monitored Anesthesia Care Procedure:                Pre-Anesthesia Assessment:                           - Prior to the procedure, a History and Physical                            was performed, and patient medications and                            allergies were reviewed. The patient's tolerance of                            previous anesthesia was also reviewed. The risks                            and benefits of the procedure and the sedation                            options and risks were discussed with the patient.                            All questions were answered, and informed consent                            was obtained. Prior Anticoagulants: The patient has                            taken no anticoagulant or antiplatelet agents. ASA                            Grade Assessment: II - A patient with mild systemic                            disease. After reviewing the risks and benefits,                            the patient was deemed in satisfactory condition to                            undergo the procedure.                           After obtaining informed consent, the endoscope was  passed under direct vision. Throughout the                            procedure, the patient's blood pressure, pulse, and                            oxygen saturations were monitored continuously. The                            Olympus Scope [id number] was introduced through the                            mouth, and advanced to the second part of duodenum.                             The upper GI endoscopy was accomplished without                            difficulty. The patient tolerated the procedure                            well. Scope In: Scope Out: Findings:                 There were esophageal mucosal changes classified as                            Barrett's stage C1-M2 per Prague criteria present                            in the lower third of the esophagus. The maximum                            longitudinal extent of these mucosal changes was 2                            cm in length, with a few small scattered islands of                            salmon colored mucosa. There was an area of slight                            nodularity in the 12 o'clock position. This was                            biopsied separately (jar 2). Salmon colored mucosa                            was biopsied with a cold forceps for histology (jar                            3) in 4 quadrants. A total of 2 specimen bottles  were sent to pathology. Estimated blood loss was                            minimal.                           The upper third of the esophagus and middle third                            of the esophagus were normal.                           A 1 cm lsiding type hiatal hernia was present.                           The gastroesophageal flap valve was visualized                            endoscopically and classified as Hill Grade III                            (minimal fold, loose to endoscope, hiatal hernia                            likely).                           Patchy mild inflammation characterized by erythema                            was found in the gastric body and in the gastric                            antrum. Biopsies were taken with a cold forceps for                            Helicobacter pylori testing (jar 1). Estimated                            blood loss was minimal.                           The  examined duodenum was normal. Complications:            No immediate complications. Estimated Blood Loss:     Estimated blood loss was minimal. Impression:               - Esophageal mucosal changes classified as                            Barrett's stage C1-M2 per Prague criteria. Biopsied.                           - Normal upper third of esophagus and middle third  of esophagus.                           - 1 cm hiatal hernia.                           - Gastroesophageal flap valve classified as Hill                            Grade III (minimal fold, loose to endoscope, hiatal                            hernia likely).                           - Gastritis. Biopsied.                           - Normal examined duodenum. Recommendation:           - Patient has a contact number available for                            emergencies. The signs and symptoms of potential                            delayed complications were discussed with the                            patient. Return to normal activities tomorrow.                            Written discharge instructions were provided to the                            patient.                           - Resume previous diet.                           - Continue present medications.                           - Await pathology results. Harry Lindau, MD 10/09/2023 10:21:31 AM

## 2023-10-10 ENCOUNTER — Telehealth: Payer: Self-pay

## 2023-10-10 NOTE — Telephone Encounter (Signed)
 Follow up call to pt, no answer.

## 2023-10-16 ENCOUNTER — Ambulatory Visit: Payer: Self-pay | Admitting: Gastroenterology

## 2023-11-05 ENCOUNTER — Telehealth: Payer: Self-pay | Admitting: Gastroenterology

## 2023-11-05 NOTE — Telephone Encounter (Signed)
 Inbound call from patient stated she was last seen with Dr.Cirigliano on June 11th for her upper endoscopy procedure and he had referred her to a  GI specialist at [facility] and she hasn't received a call to schedule an appointment and its been almost 4 weeks. Informed patient the referral was faxed over June 20th and they'll be reaching out to her to schedule. Please advise  Thank you

## 2023-11-05 NOTE — Telephone Encounter (Signed)
 Nothing further to advise at this time.

## 2023-11-06 ENCOUNTER — Telehealth: Payer: Self-pay

## 2023-11-06 DIAGNOSIS — Z860100 Personal history of colon polyps, unspecified: Secondary | ICD-10-CM

## 2023-11-06 NOTE — Telephone Encounter (Signed)
-----   Message from Katherine Blair sent at 11/04/2023  4:03 PM EDT ----- Received a copy from patient's last colonoscopy dated 04/09/2022, completed by Dr. Ernesto at [facility] and was notable for 3 subcentimeter polyps resected from the transverse colon, 6 polyps ranging 3-12 mm resected from the sigmoid colon, and several rectosigmoid and rectal polyps resected.  Pathology report reviewed and all hyperplastic polyps.  Based on the number of polyps, was recommended to repeat in 1 year.  Colonoscopy also notable for adequate prep, moderate diverticulosis in the descending and sigmoid colon requiring exchange to pediatric colonoscope, and internal/external hemorrhoids.    Based on these results and the recommendation from his prior Endoscopist, please schedule for routine repeat colonoscopy for ongoing surveillance.

## 2023-11-06 NOTE — Telephone Encounter (Signed)
 Left message for patient to return call to schedule colonoscopy with Dr San.  Will continue efforts.

## 2023-11-12 MED ORDER — NA SULFATE-K SULFATE-MG SULF 17.5-3.13-1.6 GM/177ML PO SOLN: 1.0000 | ORAL | 0 refills | Status: DC

## 2023-11-12 NOTE — Telephone Encounter (Signed)
 Patient aware that she is scheduled to have colonoscopy on 12-19-23 at 1pm with Dr San.  Colonoscopy prep instructions were discussed by phone and sent to patient in MyChart for her review.  Patient agreed to plan and verbalized understanding.  No further questions.

## 2023-12-11 ENCOUNTER — Encounter: Payer: Self-pay | Admitting: Gastroenterology

## 2023-12-19 ENCOUNTER — Encounter: Payer: Self-pay | Admitting: Gastroenterology

## 2023-12-19 ENCOUNTER — Ambulatory Visit: Admitting: Gastroenterology

## 2023-12-19 VITALS — BP 128/80 | HR 62 | Temp 97.8°F | Resp 17 | Ht 66.0 in | Wt 179.0 lb

## 2023-12-19 DIAGNOSIS — Z860100 Personal history of colon polyps, unspecified: Secondary | ICD-10-CM

## 2023-12-19 DIAGNOSIS — D124 Benign neoplasm of descending colon: Secondary | ICD-10-CM

## 2023-12-19 DIAGNOSIS — K219 Gastro-esophageal reflux disease without esophagitis: Secondary | ICD-10-CM

## 2023-12-19 DIAGNOSIS — Z1211 Encounter for screening for malignant neoplasm of colon: Secondary | ICD-10-CM

## 2023-12-19 DIAGNOSIS — Q439 Congenital malformation of intestine, unspecified: Secondary | ICD-10-CM

## 2023-12-19 DIAGNOSIS — K635 Polyp of colon: Secondary | ICD-10-CM

## 2023-12-19 DIAGNOSIS — K5730 Diverticulosis of large intestine without perforation or abscess without bleeding: Secondary | ICD-10-CM

## 2023-12-19 DIAGNOSIS — D125 Benign neoplasm of sigmoid colon: Secondary | ICD-10-CM

## 2023-12-19 DIAGNOSIS — D128 Benign neoplasm of rectum: Secondary | ICD-10-CM

## 2023-12-19 DIAGNOSIS — K621 Rectal polyp: Secondary | ICD-10-CM | POA: Diagnosis not present

## 2023-12-19 DIAGNOSIS — D120 Benign neoplasm of cecum: Secondary | ICD-10-CM | POA: Diagnosis not present

## 2023-12-19 DIAGNOSIS — R1319 Other dysphagia: Secondary | ICD-10-CM

## 2023-12-19 MED ORDER — SODIUM CHLORIDE 0.9 % IV SOLN: 500.0000 mL | Freq: Once | INTRAVENOUS | Status: DC

## 2023-12-19 NOTE — Progress Notes (Signed)
 Called to room to assist during endoscopic procedure.  Patient ID and intended procedure confirmed with present staff. Received instructions for my participation in the procedure from the performing physician.

## 2023-12-19 NOTE — Progress Notes (Signed)
 Vitals-DT  Pt's states no medical or surgical changes since previsit or office visit.

## 2023-12-19 NOTE — Op Note (Signed)
 [facility] Patient Name: Katherine Blair Procedure Date: 12/19/2023 1:53 PM MRN: [id number] Endoscopist: Sandor Flatter , MD, [id number] Age: 58 Referring MD:  Date of Birth: December 30, 1965 Gender: Female Account #: [id number] Procedure:                Colonoscopy Indications:              Surveillance: Personal history of colonic polyps                            (unknown histology) on last colonoscopy 5 years ago Medicines:                Monitored Anesthesia Care Procedure:                Pre-Anesthesia Assessment:                           - Prior to the procedure, a History and Physical                            was performed, and patient medications and                            allergies were reviewed. The patient's tolerance of                            previous anesthesia was also reviewed. The risks                            and benefits of the procedure and the sedation                            options and risks were discussed with the patient.                            All questions were answered, and informed consent                            was obtained. Prior Anticoagulants: The patient has                            taken no anticoagulant or antiplatelet agents. ASA                            Grade Assessment: II - A patient with mild systemic                            disease. After reviewing the risks and benefits,                            the patient was deemed in satisfactory condition to                            undergo the procedure.  After obtaining informed consent, the colonoscope                            was passed under direct vision. Throughout the                            procedure, the patient's blood pressure, pulse, and                            oxygen saturations were monitored continuously. The                            Olympus Scope SN [id number] was introduced through the                            anus  and advanced to the the terminal ileum. The                            colonoscopy was performed without difficulty. The                            patient tolerated the procedure well. The quality                            of the bowel preparation was good. The terminal                            ileum, ileocecal valve, appendiceal orifice, and                            rectum were photographed. Scope In: 2:15:53 PM Scope Out: 2:40:44 PM Scope Withdrawal Time: 0 hours 17 minutes 16 seconds  Total Procedure Duration: 0 hours 24 minutes 51 seconds  Findings:                 The perianal and digital rectal examinations were                            normal.                           A 3 mm polyp was found in the cecum. The polyp was                            sessile. The polyp was removed with a cold snare.                            Resection and retrieval were complete. Estimated                            blood loss was minimal.                           Two mucous-capped and semi-sessile polyps were  found in the sigmoid colon and descending colon.                            The polyps were 3 to 4 mm in size. These polyps                            were removed with a cold snare. Resection and                            retrieval were complete. Estimated blood loss was                            minimal.                           A 5 mm polyp was found in the sigmoid colon. The                            polyp was sessile. The polyp was removed with a                            cold snare. Resection and retrieval were complete.                            Estimated blood loss was minimal.                           Five sessile polyps were found in the rectum. The                            polyps were 2 to 4 mm in size. These polyps were                            removed with a cold snare. Resection and retrieval                            were complete.  Estimated blood loss was minimal.                           Multiple medium-mouthed and small-mouthed                            diverticula were found in the sigmoid colon, distal                            descending colon and cecum.                           The sigmoid colon was significantly tortuous.                            Advancing the scope required using manual pressure  and straightening and shortening the scope to                            obtain bowel loop reduction.                           The retroflexed view of the distal rectum and anal                            verge was normal and showed no anal or rectal                            abnormalities.                           The terminal ileum appeared normal. Complications:            No immediate complications. Estimated Blood Loss:     Estimated blood loss was minimal. Impression:               - One 3 mm polyp in the cecum, removed with a cold                            snare. Resected and retrieved.                           - Two 3 to 4 mm polyps in the sigmoid colon and in                            the descending colon, removed with a cold snare.                            Resected and retrieved.                           - One 5 mm polyp in the sigmoid colon, removed with                            a cold snare. Resected and retrieved.                           - Five 2 to 4 mm polyps in the rectum, removed with                            a cold snare. Resected and retrieved.                           - Diverticulosis in the sigmoid colon, in the                            distal descending colon and in the cecum.                           - Tortuous colon.                           -  The distal rectum and anal verge are normal on                            retroflexion view.                           - The examined portion of the ileum was normal. Recommendation:           -  Patient has a contact number available for                            emergencies. The signs and symptoms of potential                            delayed complications were discussed with the                            patient. Return to normal activities tomorrow.                            Written discharge instructions were provided to the                            patient.                           - Resume previous diet.                           - Continue present medications.                           - Await pathology results.                           - Repeat colonoscopy for surveillance based on                            pathology results.                           - Return to GI office PRN. Sandor Flatter, MD 12/19/2023 3:05:10 PM

## 2023-12-19 NOTE — Progress Notes (Signed)
 Sedate, gd SR, tolerated procedure well, VSS, report to RN

## 2023-12-19 NOTE — Progress Notes (Signed)
 GASTROENTEROLOGY PROCEDURE H&P NOTE   Primary Care Physician: Alec House, MD    Reason for Procedure:  Colon polyp surveillance  Plan:    Colonoscopy  Patient is appropriate for endoscopic procedure(s) in the ambulatory ([facility]) setting.  The nature of the procedure, as well as the risks, benefits, and alternatives were carefully and thoroughly reviewed with the patient. Ample time for discussion and questions allowed. The patient understood, was satisfied, and agreed to proceed.     HPI: Katherine Blair is a 58 y.o. female who presents for colonoscopy for ongoing polyp surveillance.  Last colonoscopy was at [facility] and notable for polyps.  No reports available for review.  Past Medical History:  Diagnosis Date   COPD (chronic obstructive pulmonary disease) (HCC)    Cushing's syndrome (HCC)    Diabetes mellitus without complication (HCC)    GERD (gastroesophageal reflux disease)    Hypertension    Thyroid  disease     Past Surgical History:  Procedure Laterality Date   BLADDER SURGERY     CESAREAN SECTION     DILATION AND CURETTAGE OF UTERUS     PARTIAL HYSTERECTOMY     TUBAL LIGATION      Prior to Admission medications   Medication Sig Start Date End Date Taking? Authorizing Provider  fluticasone  (FLONASE ) 50 MCG/ACT nasal spray Place 2 sprays into both nostrils daily. 07/25/23   Sira, Zackery, MD  gabapentin  (NEURONTIN ) 600 MG tablet Take 600 mg by mouth 3 (three) times daily.    [provider]  guaiFENesin -dextromethorphan  (ROBITUSSIN DM) 100-10 MG/5ML syrup Take 5 mLs by mouth every 4 (four) hours as needed for cough. Patient not taking: Reported on 10/02/2023 07/24/23   Sira, Zackery, MD  Insulin  Glargine (BASAGLAR  KWIKPEN) 100 UNIT/ML Inject 15 Units into the skin daily. Patient taking differently: Inject 25 Units into the skin daily. 07/24/23 10/09/23  Sira, Zackery, MD  insulin  lispro (HUMALOG  KWIKPEN) 100 UNIT/ML KwikPen Inject 2 Units into the  skin 3 (three) times daily with meals. Patient taking differently: Inject 25-50 Units into the skin 3 (three) times daily with meals. 07/24/23 10/09/23  Sira, Zackery, MD  Insulin  Pen Needle 32G X 4 MM MISC Check sugars three times a day 07/24/23   Sira, Zackery, MD  Na Sulfate-K Sulfate-Mg Sulfate concentrate (SUPREP BOWEL PREP KIT) 17.5-3.13-1.6 GM/177ML SOLN Take 1 kit (354 mLs total) by mouth as directed. 11/12/23   Mehtaab Mayeda V, DO  pantoprazole  (PROTONIX ) 40 MG tablet Take 1 tablet (40 mg total) by mouth 2 (two) times daily. 07/24/23 10/09/23  Sira, Zackery, MD  sertraline  (ZOLOFT ) 100 MG tablet Take 100 mg by mouth daily. 10/05/23   [provider]    Current Outpatient Medications  Medication Sig Dispense Refill   fluticasone  (FLONASE ) 50 MCG/ACT nasal spray Place 2 sprays into both nostrils daily. 16 g 0   gabapentin  (NEURONTIN ) 600 MG tablet Take 600 mg by mouth 3 (three) times daily.     guaiFENesin -dextromethorphan  (ROBITUSSIN DM) 100-10 MG/5ML syrup Take 5 mLs by mouth every 4 (four) hours as needed for cough. (Patient not taking: Reported on 10/02/2023) 118 mL 0   Insulin  Glargine (BASAGLAR  KWIKPEN) 100 UNIT/ML Inject 15 Units into the skin daily. (Patient taking differently: Inject 25 Units into the skin daily.) 6 mL 0   insulin  lispro (HUMALOG  KWIKPEN) 100 UNIT/ML KwikPen Inject 2 Units into the skin 3 (three) times daily with meals. (Patient taking differently: Inject 25-50 Units into the skin 3 (three) times  daily with meals.) 3 mL 0   Insulin  Pen Needle 32G X 4 MM MISC Check sugars three times a day 100 each 0   Na Sulfate-K Sulfate-Mg Sulfate concentrate (SUPREP BOWEL PREP KIT) 17.5-3.13-1.6 GM/177ML SOLN Take 1 kit (354 mLs total) by mouth as directed. 324 mL 0   pantoprazole  (PROTONIX ) 40 MG tablet Take 1 tablet (40 mg total) by mouth 2 (two) times daily. 60 tablet 0   sertraline  (ZOLOFT ) 100 MG tablet Take 100 mg by mouth daily.     No current facility-administered  medications for this visit.    Allergies as of 12/19/2023 - Review Complete 10/09/2023  Allergen Reaction Noted   Penicillins Rash 02/16/2019    Family History  Problem Relation Age of Onset   Diabetes Mother    Bipolar disorder Father    Diabetes Father    Drug abuse Sister    Diabetes Sister     Social History   Socioeconomic History   Marital status: Single    Spouse name: Not on file   Number of children: Not on file   Years of education: Not on file   Highest education level: Not on file  Occupational History   Not on file  Tobacco Use   Smoking status: Former    Current packs/day: 1.00    Average packs/day: 1 pack/day for 30.0 years (30.0 ttl pk-yrs)    Types: Cigarettes   Smokeless tobacco: Never  Vaping Use   Vaping status: Never Used  Substance and Sexual Activity   Alcohol use: Not Currently    Comment: social   Drug use: No   Sexual activity: Not on file  Other Topics Concern   Not on file  Social History Narrative   Not on file   Social Drivers of Health   Financial Resource Strain: Not on file  Food Insecurity: Food Insecurity Present (07/20/2023)   Hunger Vital Sign    Worried About Running Out of Food in the Last Year: Sometimes true    Ran Out of Food in the Last Year: Sometimes true  Transportation Needs: No Transportation Needs (07/20/2023)   PRAPARE - Administrator, Civil Service (Medical): No    Lack of Transportation (Non-Medical): No  Physical Activity: Not on file  Stress: Not on file  Social Connections: Unknown (09/09/2021)   Received from [facility]   Social Network    Social Network: Not on file  Intimate Partner Violence: Not At Risk (07/20/2023)   Humiliation, Afraid, Rape, and Kick questionnaire    Fear of Current or Ex-Partner: No    Emotionally Abused: No    Physically Abused: No    Sexually Abused: No    Physical Exam: Vital signs in last 24 hours: @There  were no vitals taken for this visit. GEN:  NAD EYE: Sclerae anicteric ENT: MMM CV: Non-tachycardic Pulm: CTA b/l GI: Soft, NT/ND NEURO:  Alert & Oriented x 3   Sandor Flatter, DO [facility]   12/19/2023 12:57 PM

## 2023-12-19 NOTE — Patient Instructions (Addendum)
 Educational handout provided to patient related to Polyps and Diverticulosis  Resume previous diet  Continue present medications  Awaiting pathology results  YOU HAD AN ENDOSCOPIC PROCEDURE TODAY AT THE [facility]:   Refer to the procedure report that was given to you for any specific questions about what was found during the examination.  If the procedure report does not answer your questions, please call your gastroenterologist to clarify.  If you requested that your care partner not be given the details of your procedure findings, then the procedure report has been included in a sealed envelope for you to review at your convenience later.  YOU SHOULD EXPECT: Some feelings of bloating in the abdomen. Passage of more gas than usual.  Walking can help get rid of the air that was put into your GI tract during the procedure and reduce the bloating. If you had a lower endoscopy (such as a colonoscopy or flexible sigmoidoscopy) you may notice spotting of blood in your stool or on the toilet paper. If you underwent a bowel prep for your procedure, you may not have a normal bowel movement for a few days.  Please Note:  You might notice some irritation and congestion in your nose or some drainage.  This is from the oxygen used during your procedure.  There is no need for concern and it should clear up in a day or so.  SYMPTOMS TO REPORT IMMEDIATELY:  Following lower endoscopy (colonoscopy or flexible sigmoidoscopy):  Excessive amounts of blood in the stool  Significant tenderness or worsening of abdominal pains  Swelling of the abdomen that is new, acute  Fever of 100F or higher  For urgent or emergent issues, a gastroenterologist can be reached at any hour by calling [phone]. Do not use MyChart messaging for urgent concerns.    DIET:  We do recommend a small meal at first, but then you may proceed to your regular diet.  Drink plenty of fluids but you should avoid alcoholic  beverages for 24 hours.  ACTIVITY:  You should plan to take it easy for the rest of today and you should NOT DRIVE or use heavy machinery until tomorrow (because of the sedation medicines used during the test).    FOLLOW UP: Our staff will call the number listed on your records the next business day following your procedure.  We will call around 7:15- 8:00 am to check on you and address any questions or concerns that you may have regarding the information given to you following your procedure. If we do not reach you, we will leave a message.     If any biopsies were taken you will be contacted by phone or by letter within the next 1-3 weeks.  Please call us  at [phone] if you have not heard about the biopsies in 3 weeks.    SIGNATURES/CONFIDENTIALITY: You and/or your care partner have signed paperwork which will be entered into your electronic medical record.  These signatures attest to the fact that that the information above on your After Visit Summary has been reviewed and is understood.  Full responsibility of the confidentiality of this discharge information lies with you and/or your care-partner.

## 2023-12-20 ENCOUNTER — Telehealth: Payer: Self-pay

## 2023-12-20 NOTE — Telephone Encounter (Signed)
  Follow up Call-     12/19/2023    1:21 PM 12/19/2023    1:07 PM 10/09/2023    8:40 AM  Call back number  Post procedure Call Back phone  # [phone]  [phone]  Permission to leave phone message  Yes Yes    Follow up call, LVM

## 2023-12-24 ENCOUNTER — Ambulatory Visit: Payer: Self-pay | Admitting: Gastroenterology

## 2023-12-24 LAB — SURGICAL PATHOLOGY

## 2024-04-07 ENCOUNTER — Emergency Department (HOSPITAL_BASED_OUTPATIENT_CLINIC_OR_DEPARTMENT_OTHER)

## 2024-04-07 ENCOUNTER — Emergency Department (HOSPITAL_BASED_OUTPATIENT_CLINIC_OR_DEPARTMENT_OTHER)
Admission: EM | Admit: 2024-04-07 | Discharge: 2024-04-07 | Disposition: A | Discharge status: Home / Self Care | Attending: Emergency Medicine | Admitting: Emergency Medicine

## 2024-04-07 ENCOUNTER — Other Ambulatory Visit: Payer: Self-pay

## 2024-04-07 DIAGNOSIS — S82839A Other fracture of upper and lower end of unspecified fibula, initial encounter for closed fracture: Secondary | ICD-10-CM

## 2024-04-07 MED ORDER — ACETAMINOPHEN 500 MG PO TABS
1000.0000 mg | ORAL_TABLET | Freq: Once | ORAL | Status: AC
  Administered 2024-04-07: 1000 mg via ORAL
  Filled 2024-04-07: qty 2

## 2024-04-07 MED ORDER — OXYCODONE HCL 5 MG PO TABS
5.0000 mg | ORAL_TABLET | Freq: Once | ORAL | Status: AC
  Administered 2024-04-07: 5 mg via ORAL
  Filled 2024-04-07: qty 1

## 2024-04-07 MED ORDER — OXYCODONE HCL 5 MG PO TABS: 5.0000 mg | ORAL_TABLET | Freq: Four times a day (QID) | ORAL | 0 refills | Status: AC | PRN

## 2024-04-07 NOTE — ED Notes (Signed)
 Patient verbalizes understanding of discharge instructions. Opportunity for questioning and answers were provided. Armband removed by staff, pt discharged from ED. Wheeled out to daughter's car, assisted in for transport home.

## 2024-04-07 NOTE — Discharge Instructions (Addendum)
 Katherine Blair  Thank you for allowing us  to take care of you today.  You came to the Emergency Department today because you rolled your ankle when you stepped off a curb earlier tonight.  Here in the emergency department we got x-rays which shows that you broke the tip off of your fibula, one of the bones in your ankle.  You have good sensation, and pulses in your foot.  A broken bone at the lower end of the fibula is generally stable to walk on, therefore we put you in a walking boot, we also will give you crutches that you can use as needed to reduce the amount of weight that you are bearing on your leg due to pain.  You can use Tylenol  and ibuprofen every 4-6 hours as needed for pain control.  If you have severe pain despite Tylenol  and ibuprofen we are also prescribing you oxycodone , an opioid pain medication that will help you with breakthrough pain.  Please do not drive while you are on this medication as it can make you sleepy, be aware that it also can make you constipated, and it can make you more prone to falls.  We will give you a referral to follow-up with an orthopedic surgeon.  To-Do: 1. Please follow-up with your primary doctor within 1 - 2 weeks / as soon as possible.   Please return to the Emergency Department or call 911 if you experience have worsening of your symptoms, or do not get better, if you have change in color, severe pain, or difficulty moving or feeling your foot that is different from your usual, chest pain, shortness of breath, severe or significantly worsening pain, high fever, severe confusion, pass out or have any reason to think that you need emergency medical care.   We hope you feel better soon.   [name], MD Department of Emergency Medicine [facility]

## 2024-04-07 NOTE — ED Triage Notes (Signed)
 Rolled ankle while walking today. Cannot move or put weight on left ankle.

## 2024-04-07 NOTE — ED Provider Notes (Signed)
 [facility] [facility] Provider Note   CSN: [id number] Arrival date & time: 04/07/24  1956     History Chief Complaint  Patient presents with   Ankle Pain    left    HPI: Katherine Blair is a 58 y.o. female with no pertinent history who presents complaining of ankle pain. Patient arrived via POV accompanied by daughter.  History provided by patient.  No interpreter required during this encounter.  Patient reports that at approximately 5 PM today she was walking and walked off a curb and rolled her left ankle.  Reports that she had immediate pain, however did not fall, hit her head, lose consciousness, use of anticoagulation, headache, neck pain, chest pain, back pain..  Reports that she was able to ambulate back to the car, however had worsening pain and swelling has not been able to ambulate on her left ankle since then.  Reports that she has had multiple prior ankle sprain and ankle surgeries, however does not have an established orthopedic surgeon in [place].  Patient's recorded medical, surgical, social, medication list and allergies were reviewed in the Snapshot window as part of the initial history.   Prior to Admission medications   Medication Sig Start Date End Date Taking? Authorizing Provider  oxyCODONE  (ROXICODONE ) 5 MG immediate release tablet Take 1 tablet (5 mg total) by mouth every 6 (six) hours as needed for up to 5 days for breakthrough pain. 04/07/24 04/12/24 Yes Rogelia Jerilynn RAMAN, MD  fluticasone  (FLONASE ) 50 MCG/ACT nasal spray Place 2 sprays into both nostrils daily. 07/25/23   Sira, Zackery, MD  gabapentin  (NEURONTIN ) 600 MG tablet Take 600 mg by mouth 3 (three) times daily.    [provider]  Insulin  Glargine (BASAGLAR  KWIKPEN) 100 UNIT/ML Inject 15 Units into the skin daily. Patient taking differently: Inject 25 Units into the skin daily. 07/24/23 12/19/23  Sira, Zackery, MD  insulin  lispro (HUMALOG  KWIKPEN) 100 UNIT/ML KwikPen  Inject 2 Units into the skin 3 (three) times daily with meals. Patient taking differently: Inject 25-50 Units into the skin 3 (three) times daily with meals. 07/24/23 12/19/23  Sira, Zackery, MD  Insulin  Pen Needle 32G X 4 MM MISC Check sugars three times a day 07/24/23   Sira, Zackery, MD  pantoprazole  (PROTONIX ) 40 MG tablet Take 1 tablet (40 mg total) by mouth 2 (two) times daily. 07/24/23 12/19/23  Sira, Zackery, MD  sertraline  (ZOLOFT ) 100 MG tablet Take 100 mg by mouth daily. 10/05/23   [provider]     Allergies: Penicillins   Review of Systems   ROS as per HPI  Physical Exam Updated Vital Signs BP (!) 134/92   Pulse 92   Temp 97.6 F (36.4 C)   Resp 18   SpO2 95%  Physical Exam Vitals and nursing note reviewed.  Constitutional:      General: She is not in acute distress.    Appearance: Normal appearance. She is well-developed.  HENT:     Head: Normocephalic and atraumatic.  Eyes:     Extraocular Movements: Extraocular movements intact.     Conjunctiva/sclera: Conjunctivae normal.  Cardiovascular:     Rate and Rhythm: Normal rate and regular rhythm.     Pulses: Normal pulses.     Heart sounds: No murmur heard. Pulmonary:     Effort: Pulmonary effort is normal. No respiratory distress.     Breath sounds: Normal breath sounds.  Abdominal:     Palpations: Abdomen is soft.  Tenderness: There is no abdominal tenderness.  Musculoskeletal:        General: No swelling.     Cervical back: Neck supple. No bony tenderness. No pain with movement.     Thoracic back: No bony tenderness.     Lumbar back: No bony tenderness.     Comments: Chest wall nontender, stable to AP and lateral compression.  Pelvis is nontender, stable to AP and lateral compression.  Bilateral upper extremities appear atraumatic, right lower extremity appears atraumatic.  No pain to palpation of right hip, upper leg, knee, lower leg until the level of the ankle.  Patient with swelling about the  lateral malleolus with pain to palpation, no skin changes.  Intact sensation to light touch in all nerve distributions of foot, 2+ PT and DP pulses  Skin:    General: Skin is warm and dry.     Capillary Refill: Capillary refill takes less than 2 seconds.  Neurological:     General: No focal deficit present.     Mental Status: She is alert and oriented to person, place, and time.  Psychiatric:        Mood and Affect: Mood normal.     ED Course/ Medical Decision Making/ A&P    Procedures Procedures   Medications Ordered in ED Medications  oxyCODONE  (Oxy IR/ROXICODONE ) immediate release tablet 5 mg (5 mg Oral Given 04/07/24 2103)  acetaminophen  (TYLENOL ) tablet 1,000 mg (1,000 mg Oral Given 04/07/24 2103)    Medical Decision Making:   Katherine Blair is a 58 y.o. female who presents for ankle pain as per above.  Physical exam is pertinent for tenderness palpation of left lateral malleolus.   The differential includes but is not limited to fracture, dislocation, ICH, sprain, strain, spinal fracture.  Independent historian: None  External data reviewed: No pertinent external data  Labs: Not indicated  Radiology: Ordered, Independent interpretation, Details: Personally reviewed plain films of the left foot and ankle, I do appreciate cortical irregularity of the distal fibula, and All images reviewed independently.  Agree with radiology report at this time.   DG Foot Complete Left Result Date: 04/07/2024 CLINICAL DATA:  Injury, inability to bear weight EXAM: LEFT FOOT - COMPLETE 3+ VIEW; LEFT ANKLE COMPLETE - 3+ VIEW COMPARISON:  None Available. FINDINGS: Left ankle: Frontal, oblique, and lateral views are obtained. There is a minimally displaced transverse fracture at the tip of the lateral malleolus. Alignment is near anatomic. No other acute bony abnormalities. Prominent anterior and lateral soft tissue swelling. Left foot: Frontal, oblique, and lateral views are obtained. No acute  fracture, subluxation, or dislocation. Mild midfoot osteoarthritis. Soft tissues are unremarkable. IMPRESSION: 1. Minimally displaced avulsion fracture at the tip of the lateral malleolus, with overlying soft tissue swelling. 2. Mild midfoot osteoarthritis. Electronically Signed   By: Ozell Daring M.D.   On: 04/07/2024 20:33   DG Ankle Complete Left Result Date: 04/07/2024 CLINICAL DATA:  Injury, inability to bear weight EXAM: LEFT FOOT - COMPLETE 3+ VIEW; LEFT ANKLE COMPLETE - 3+ VIEW COMPARISON:  None Available. FINDINGS: Left ankle: Frontal, oblique, and lateral views are obtained. There is a minimally displaced transverse fracture at the tip of the lateral malleolus. Alignment is near anatomic. No other acute bony abnormalities. Prominent anterior and lateral soft tissue swelling. Left foot: Frontal, oblique, and lateral views are obtained. No acute fracture, subluxation, or dislocation. Mild midfoot osteoarthritis. Soft tissues are unremarkable. IMPRESSION: 1. Minimally displaced avulsion fracture at the tip of the lateral malleolus,  with overlying soft tissue swelling. 2. Mild midfoot osteoarthritis. Electronically Signed   By: Ozell Daring M.D.   On: 04/07/2024 20:33    EKG/Medicine tests: Not indicated EKG Interpretation:                  Interventions: Oxycodone , Tylenol   See the EMR for full details regarding lab and imaging results.  Patient presents for of left ankle, had a mechanical trip, however did not have associated fall.  Is negative per [place] CT head and CT C-spine.  Does not have focal pain or tenderness elsewhere on the thorax, abdomen, pelvis, bilateral upper extremities or right lower extremity, therefore do not feel that patient requires additional imaging of any of these areas.  Patient with distal avulsion fracture, most consistent with a Weber a fracture, which is a stable ankle fracture.  Patient given Tylenol  and oxycodone  for pain control, placed in cam boot and  provided retches for weightbearing as tolerated status.  Advised RICE therapy, Tylenol  and ibuprofen with prescription of oxycodone  for breakthrough pain, as well as referral for orthopedic surgery for follow-up.  Patient and daughter amenable to this plan, discharged in stable condition.  Presentation is most consistent with acute uncomplicated illness and I did consider and rule out acute life/limb-threatening illness  Discussion of management or test interpretations with external provider(s): Not indicated  Risk Drugs:Prescription drug management  Disposition: DISCHARGE: I believe that the patient is safe for discharge home with outpatient follow-up. Patient was informed of all pertinent physical exam, laboratory, and imaging findings. Patient's suspected etiology of their symptom presentation was discussed with the patient and all questions were answered. We discussed following up with PCP and orthopedic surgery. I provided thorough ED return precautions. The patient feels safe and comfortable with this plan.  MDM generated using voice dictation software and may contain dictation errors.  Please contact me for any clarification or with any questions.  Clinical Impression:  1. Avulsion fracture of distal fibula      Discharge   Final Clinical Impression(s) / ED Diagnoses Final diagnoses:  Avulsion fracture of distal fibula    Rx / DC Orders ED Discharge Orders          Ordered    Ambulatory referral to Orthopedic Surgery        04/07/24 2125    oxyCODONE  (ROXICODONE ) 5 MG immediate release tablet  Every 6 hours PRN        04/07/24 2128             Rogelia Jerilynn RAMAN, MD 04/07/24 2240
